# Patient Record
Sex: Male | Born: 1970 | Race: White | Hispanic: No | Marital: Married | Smoking: Never smoker
Health system: Southern US, Community
[De-identification: ages and names within clinical notes are randomized; demographics above are authoritative.]

## PROBLEM LIST (undated history)

## (undated) HISTORY — PX: WRIST SURGERY: SHX841

---

## 2012-02-03 ENCOUNTER — Encounter (HOSPITAL_BASED_OUTPATIENT_CLINIC_OR_DEPARTMENT_OTHER): Payer: Self-pay | Admitting: *Deleted

## 2012-02-03 ENCOUNTER — Emergency Department (HOSPITAL_BASED_OUTPATIENT_CLINIC_OR_DEPARTMENT_OTHER)
Admission: EM | Admit: 2012-02-03 | Discharge: 2012-02-03 | Disposition: A | Discharge status: Home / Self Care | Attending: Emergency Medicine | Admitting: Emergency Medicine

## 2012-02-03 ENCOUNTER — Emergency Department (HOSPITAL_BASED_OUTPATIENT_CLINIC_OR_DEPARTMENT_OTHER)

## 2012-02-03 DIAGNOSIS — Y9289 Other specified places as the place of occurrence of the external cause: Secondary | ICD-10-CM | POA: Insufficient documentation

## 2012-02-03 DIAGNOSIS — S9030XA Contusion of unspecified foot, initial encounter: Secondary | ICD-10-CM

## 2012-02-03 DIAGNOSIS — Y9389 Activity, other specified: Secondary | ICD-10-CM | POA: Insufficient documentation

## 2012-02-03 DIAGNOSIS — IMO0002 Reserved for concepts with insufficient information to code with codable children: Secondary | ICD-10-CM | POA: Insufficient documentation

## 2012-02-03 MED ORDER — IBUPROFEN 400 MG PO TABS
400.0000 mg | ORAL_TABLET | Freq: Once | ORAL | Status: AC
  Administered 2012-02-03: 400 mg via ORAL

## 2012-02-03 MED ORDER — IBUPROFEN 400 MG PO TABS
ORAL_TABLET | ORAL | Status: AC
  Filled 2012-02-03: qty 1

## 2012-02-03 NOTE — ED Provider Notes (Signed)
History    This chart was scribed for Hurman Horn, MD, MD by Smitty Pluck, ED Scribe. The patient was seen in room [facility] and the patient's care was started at 10:49PM.   CSN: [id number]  Arrival date & time 02/03/12  2116      Chief Complaint  Patient presents with  . Foot Injury    (Consider location/radiation/quality/duration/timing/severity/associated sxs/prior treatment) The history is provided by the patient. No language interpreter was used.   Shawn Simpson is a 41 y.o. male who presents to the Emergency Department complaining of constant, mild left foot pain due to hitting his foot with equipment at work today. Pt denies numbness in left foot and any other pain. Bleeding is controlled. Pt reports that he is able to ambulate without difficulty.   History reviewed. No pertinent past medical history.  History reviewed. No pertinent past surgical history.  History reviewed. No pertinent family history.  History  Substance Use Topics  . Smoking status: Never Smoker   . Smokeless tobacco: Not on file  . Alcohol Use: No      Review of Systems 10 Systems reviewed and all are negative for acute change except as noted in the HPI.   Allergies  Sulfa antibiotics  Home Medications  No current outpatient prescriptions on file.  BP 108/72  Pulse 78  Temp 97.9 F (36.6 C) (Oral)  Resp 78  Ht 5\' 11"  (1.803 m)  Wt 200 lb (90.719 kg)  BMI 27.89 kg/m2  SpO2 100%  Physical Exam  Nursing note and vitals reviewed. Constitutional:       Awake, alert, nontoxic appearance.  HENT:  Head: Atraumatic.  Eyes: Right eye exhibits no discharge. Left eye exhibits no discharge.  Neck: Neck supple.  Pulmonary/Chest: Effort normal. He exhibits no tenderness.  Abdominal: Soft. There is no tenderness. There is no rebound.  Musculoskeletal: He exhibits no tenderness.       Left foot DP pulse intact CR less than 2 seconds in all toes of left foot Nl light touch in left foot Non  tender left ankle, left calcaneous and left mid foot  Diffuse tenderness over left forefoot and left toes  Small subungual hematoma under distal tip over left get toenail   Neurological:       Mental status and motor strength appears baseline for patient and situation.  Skin: No rash noted.  Psychiatric: He has a normal mood and affect.    ED Course  Procedures (including critical care time) DIAGNOSTIC STUDIES: Oxygen Saturation is 100% on room air, normal by my interpretation.    COORDINATION OF CARE: 10:52 PM Patient / Family / Caregiver understand and agree with initial ED impression and plan with expectations set for ED visit.    Labs Reviewed - No data to display Dg Foot Complete Left  02/03/2012  *RADIOLOGY REPORT*  Clinical Data: Foot injury, swelling at toes, numbness, nail bed injury great toe  LEFT FOOT - COMPLETE 3+ VIEW  Comparison: None  Findings: Bone mineralization normal. Joint spaces preserved. No fracture, dislocation, or bone destruction.  IMPRESSION: No acute osseous abnormalities.   Original Report Authenticated By: Ulyses Southward, M.D.      1. Foot contusion       MDM  Patient / Family / Caregiver informed of clinical course, understand medical decision-making process, and agree with plan.  Pt stable in ED with no significant deterioration in condition.  I doubt any other EMC precluding discharge at this time including, but not  necessarily limited to the following:displaced Fx.      I personally performed the services described in this documentation, which was scribed in my presence. The recorded information has been reviewed and is accurate.    Hurman Horn, MD 02/04/12 1323

## 2012-02-03 NOTE — ED Notes (Signed)
Pt states he ran over his left foot with a piece of equipment while at work earlier this p.m. Bleeding around nail and bruising also noted. Does not need UDS.

## 2012-08-26 ENCOUNTER — Emergency Department (HOSPITAL_BASED_OUTPATIENT_CLINIC_OR_DEPARTMENT_OTHER): Payer: Self-pay

## 2012-08-26 ENCOUNTER — Encounter (HOSPITAL_BASED_OUTPATIENT_CLINIC_OR_DEPARTMENT_OTHER): Payer: Self-pay | Admitting: *Deleted

## 2012-08-26 ENCOUNTER — Emergency Department (HOSPITAL_BASED_OUTPATIENT_CLINIC_OR_DEPARTMENT_OTHER)
Admission: EM | Admit: 2012-08-26 | Discharge: 2012-08-26 | Disposition: A | Discharge status: Home / Self Care | Payer: Self-pay | Attending: Emergency Medicine | Admitting: Emergency Medicine

## 2012-08-26 DIAGNOSIS — W19XXXA Unspecified fall, initial encounter: Secondary | ICD-10-CM | POA: Insufficient documentation

## 2012-08-26 DIAGNOSIS — M795 Residual foreign body in soft tissue: Secondary | ICD-10-CM | POA: Insufficient documentation

## 2012-08-26 DIAGNOSIS — Y9389 Activity, other specified: Secondary | ICD-10-CM | POA: Insufficient documentation

## 2012-08-26 DIAGNOSIS — Y9289 Other specified places as the place of occurrence of the external cause: Secondary | ICD-10-CM | POA: Insufficient documentation

## 2012-08-26 DIAGNOSIS — W28XXXA Contact with powered lawn mower, initial encounter: Secondary | ICD-10-CM | POA: Insufficient documentation

## 2012-08-26 DIAGNOSIS — S81009A Unspecified open wound, unspecified knee, initial encounter: Secondary | ICD-10-CM | POA: Insufficient documentation

## 2012-08-26 DIAGNOSIS — S91009A Unspecified open wound, unspecified ankle, initial encounter: Secondary | ICD-10-CM | POA: Insufficient documentation

## 2012-08-26 MED ORDER — TRAMADOL HCL 50 MG PO TABS: 50.0000 mg | ORAL_TABLET | Freq: Four times a day (QID) | ORAL | Status: DC | PRN

## 2012-08-26 NOTE — ED Notes (Signed)
Right lower leg injury 4 days ago. He never had the laceration sutured. Now he has redness, bruising and pain at the site. He thinks he has a foreign body inside his skin.

## 2012-08-26 NOTE — ED Provider Notes (Signed)
Medical screening examination/treatment/procedure(s) were performed by non-physician practitioner and as supervising physician I was immediately available for consultation/collaboration.   Carleene Cooper III, MD 08/26/12 [phone]

## 2012-08-26 NOTE — ED Provider Notes (Signed)
History    CSN: [id number] Arrival date & time 08/26/12  1338  First MD Initiated Contact with Patient 08/26/12 1413     Chief Complaint  Patient presents with  . Puncture Wound   (Consider location/radiation/quality/duration/timing/severity/associated sxs/prior Treatment) HPI  Patient is a 42 year old male presented to the emergency department for right lower leg injury that occurred 4 days ago while he was mowing his lawn. Patient states he obtained a small laceration to his right calf but did not have the injury evaluation until now. Patient states he went for the morning and felt like something is inside. He also noted some dark bloody drainage from laceration site during his run that has since stopped. Patient states he has no pain and has not noticed any surrounding erythema. Denies fevers or chills, numbness or tingling in extremity.   History reviewed. No pertinent past medical history. Past Surgical History  Procedure Laterality Date  . Wrist surgery     No family history on file. History  Substance Use Topics  . Smoking status: Never Smoker   . Smokeless tobacco: Not on file  . Alcohol Use: No    Review of Systems  Constitutional: Negative for fever and chills.  Musculoskeletal: Negative for gait problem.  Skin: Positive for wound.    Allergies  Sulfa antibiotics  Home Medications   Current Outpatient Rx  Name  Route  Sig  Dispense  Refill  . traMADol (ULTRAM) 50 MG tablet   Oral   Take 1 tablet (50 mg total) by mouth every 6 (six) hours as needed for pain.   10 tablet   0    BP 130/77  Pulse 94  Temp(Src) 98.6 F (37 C) (Oral)  Resp 18  Ht 6' (1.829 m)  Wt 225 lb (102.059 kg)  BMI 30.51 kg/m2  SpO2 100% Physical Exam  Constitutional: He is oriented to person, place, and time. He appears well-developed and well-nourished. No distress.  HENT:  Head: Normocephalic and atraumatic.  Eyes: Conjunctivae are normal.  Neck: Neck supple.   Cardiovascular: Intact distal pulses.   Musculoskeletal: He exhibits no edema and no tenderness.  Neurological: He is alert and oriented to person, place, and time. He has normal strength. No sensory deficit.  Skin: Skin is warm and dry. Laceration noted. He is not diaphoretic.  2 cm laceration on right calf partially healed.  FB palpated on right calf. No surrounding erythema. No drainage from wound.   Psychiatric: He has a normal mood and affect.    ED Course  FOREIGN BODY REMOVAL Date/Time: 08/26/2012 4:05 PM Performed by: Jeannetta Ellis Authorized by: Jeannetta Ellis Consent: Verbal consent obtained. Consent given by: patient Patient understanding: patient states understanding of the procedure being performed Patient identity confirmed: verbally with patient Time out: Immediately prior to procedure a "time out" was called to verify the correct patient, procedure, equipment, support staff and site/side marked as required. Body area: skin General location: lower extremity Location details: right lower leg Anesthesia: local infiltration Local anesthetic: lidocaine 2% without epinephrine Anesthetic total: 10 ml Patient sedated: no Patient restrained: no Patient cooperative: yes Localization method: serial x-rays Removal mechanism: scalpel and hemostat Dressing: dressing applied Tendon involvement: none Depth: subcutaneous Complexity: simple 2 objects recovered. Objects recovered: metal tab and wire Post-procedure assessment: foreign body removed Patient tolerance: Patient tolerated the procedure well with no immediate complications. Comments: Serial x-rays confirm FB removal.    (including critical care time)  LACERATION REPAIR Performed by: Silvestre Moment,  Dailah Opperman L Authorized by: Jeannetta Ellis Consent: Verbal consent obtained. Risks and benefits: risks, benefits and alternatives were discussed Consent given by: patient Patient identity  confirmed: provided demographic data Prepped and Draped in normal sterile fashion Wound explored  Laceration Location: right calf  Laceration Length: 2 cm  No Foreign Bodies seen or palpated  Anesthesia: local infiltration  Local anesthetic: lidocaine 2% w/o epinephrine  Anesthetic total: 4 ml  Irrigation method: syringe Amount of cleaning: standard  Skin closure: staples  Number of sutures: 3  Technique: staples  Patient tolerance: Patient tolerated the procedure well with no immediate complications.   Labs Reviewed - No data to display Dg Knee 1-2 Views Right  08/26/2012   *RADIOLOGY REPORT*  Clinical Data: Foreign body removal  RIGHT KNEE - 1-2 VIEW  Comparison: Earlier today  Findings: There is been interval removal of the radio-opaque soft tissue foreign body from the posterior medial calf. The osseous structures are intact.  There is mild degenerative change involving the knee joint.  IMPRESSION:  1.  Interval removal of foreign body.   Original Report Authenticated By: Signa Kell, M.D.   Dg Tibia/fibula Right  08/26/2012   *RADIOLOGY REPORT*  Clinical Data: puncture wound due to trauma object thrown out of lawnmower.  RIGHT TIBIA AND FIBULA - 2 VIEW  Comparison: None.  Findings: Two-view study shows a radiopaque foreign body within the soft tissues of the posteromedial calf.  No underlying bony abnormality.  IMPRESSION: Radiopaque soft tissue foreign body in the posteromedial calf.   Original Report Authenticated By: Kennith Center, M.D.   1. Foreign body (FB) in soft tissue     MDM  Tdap UTD. FB noted on X-ray. Successfully removed with superficial opening made with scalpel. Two metal pieces successfully removed. Patient tolerated procedure well. On serial x-ray no FB visualized. Site closed with staples w/o complication. Pt has no co morbidities to effect normal wound healing. Discussed staple home care w pt and answered questions. Pt to f-u for wound check and suture  removal in 10 days. Pt is hemodynamically stable w no complaints prior to dc.    Jeannetta Ellis, PA-C 08/26/12 1646

## 2014-01-07 IMAGING — CR DG TIBIA/FIBULA 2V*R*
4 series · 4 of 4 positions shown · non-contrast
Comparison: None.

CLINICAL DATA: puncture wound due to trauma object thrown out of
lawnmower.

RIGHT TIBIA AND FIBULA - 2 VIEW

[t tib/fib ap right (1 of 2)]
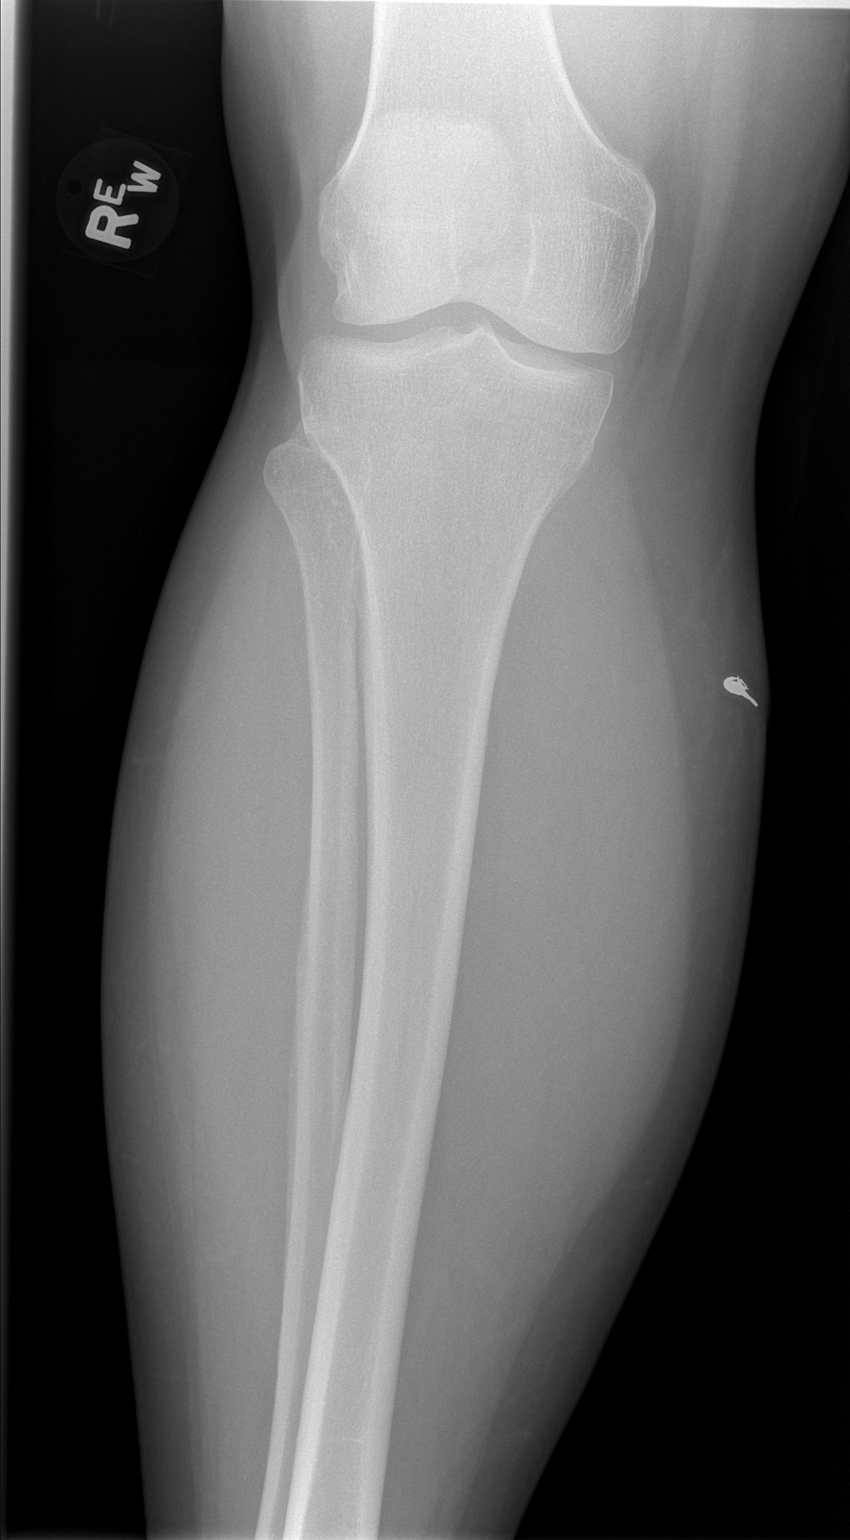

[t tib/fib ap right (2 of 2)]
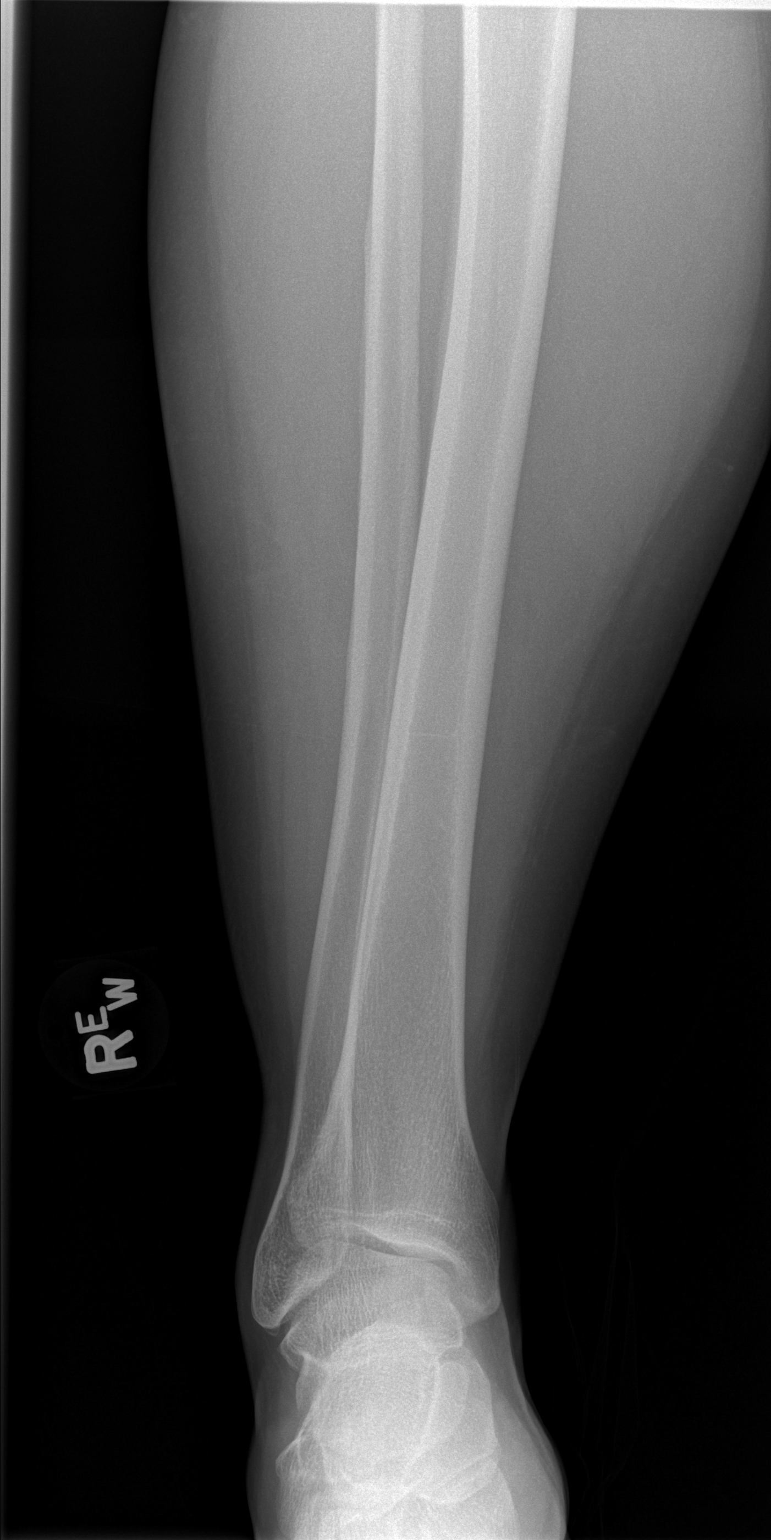

[t tib/fib lat right (1 of 2)]
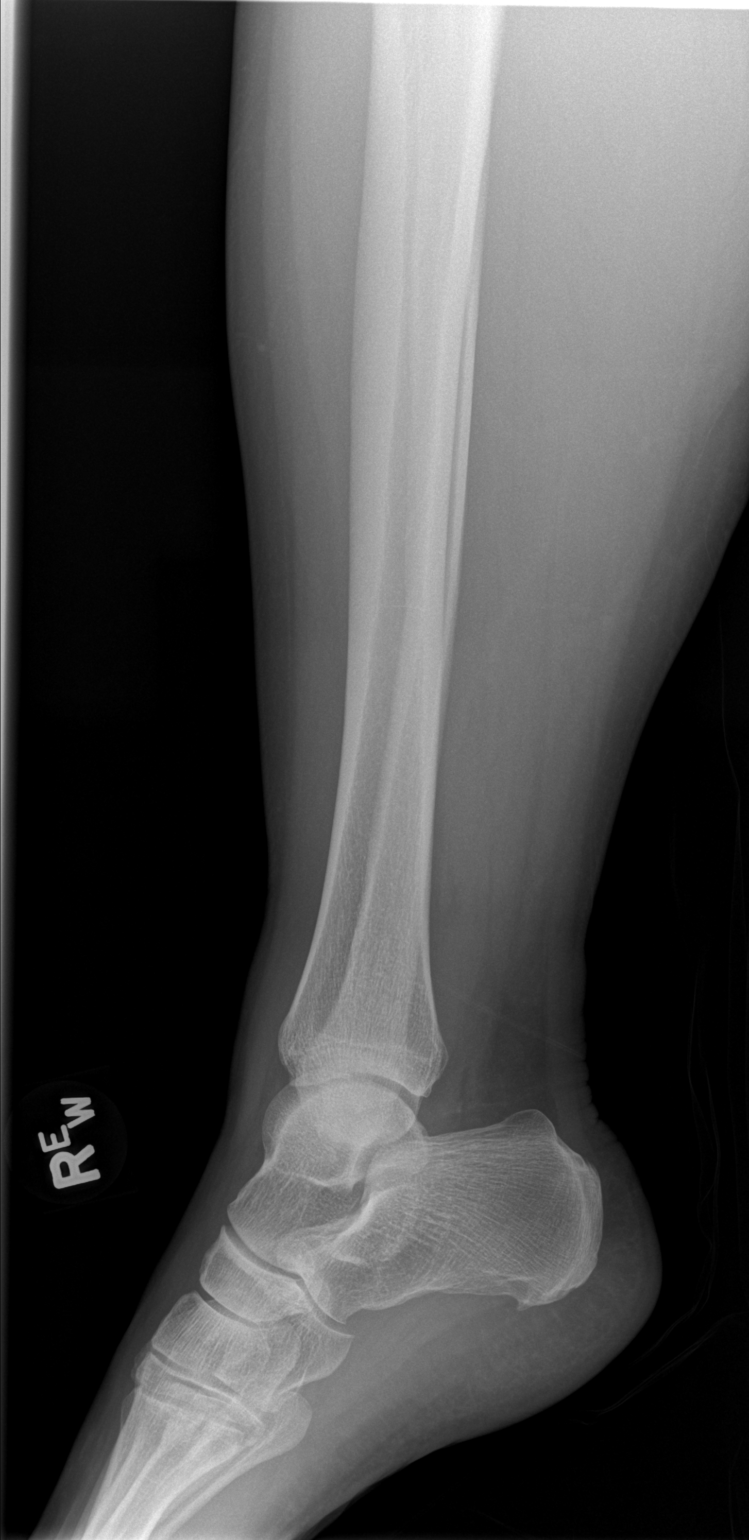

[t tib/fib lat right (2 of 2)]
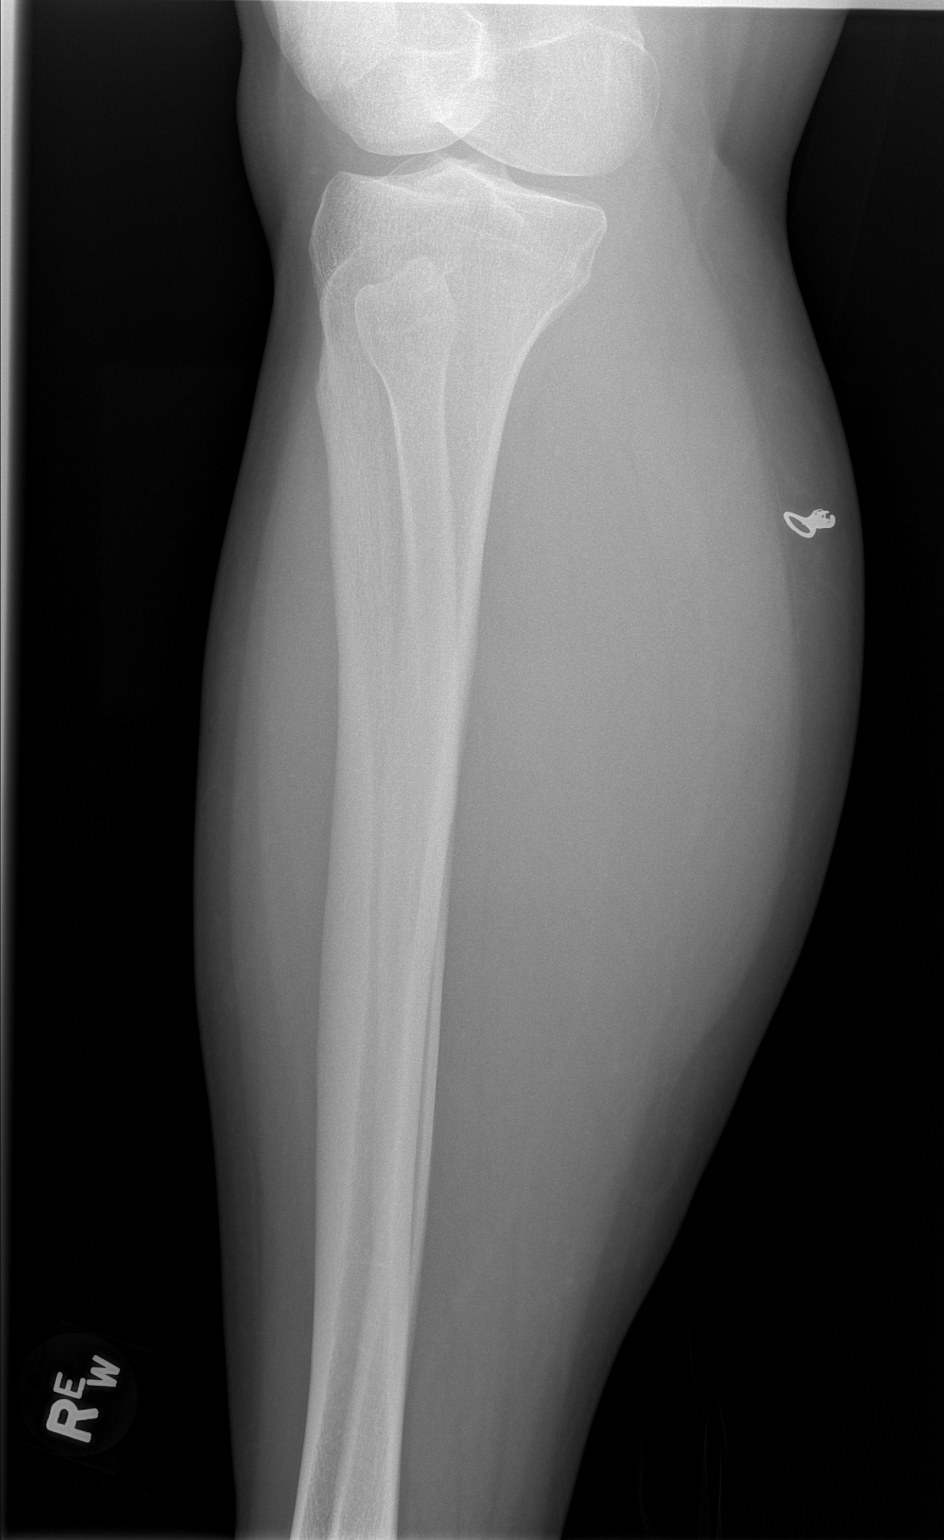

[4 of 4 positions shown; findings below may reference images not displayed]

FINDINGS: Two-view study shows a radiopaque foreign body within the
soft tissues of the posteromedial calf.  No underlying bony
abnormality.
IMPRESSION: Radiopaque soft tissue foreign body in the posteromedial calf.

## 2014-03-30 DIAGNOSIS — E785 Hyperlipidemia, unspecified: Secondary | ICD-10-CM | POA: Insufficient documentation

## 2015-01-07 ENCOUNTER — Telehealth: Payer: Self-pay | Admitting: *Deleted

## 2015-01-07 ENCOUNTER — Encounter: Payer: Self-pay | Admitting: Sports Medicine

## 2015-01-07 ENCOUNTER — Ambulatory Visit (INDEPENDENT_AMBULATORY_CARE_PROVIDER_SITE_OTHER): Payer: Federal, State, Local not specified - PPO

## 2015-01-07 ENCOUNTER — Ambulatory Visit (INDEPENDENT_AMBULATORY_CARE_PROVIDER_SITE_OTHER): Payer: Federal, State, Local not specified - PPO | Admitting: Sports Medicine

## 2015-01-07 DIAGNOSIS — M216X2 Other acquired deformities of left foot: Secondary | ICD-10-CM

## 2015-01-07 DIAGNOSIS — M79673 Pain in unspecified foot: Secondary | ICD-10-CM | POA: Diagnosis not present

## 2015-01-07 DIAGNOSIS — M216X1 Other acquired deformities of right foot: Secondary | ICD-10-CM

## 2015-01-07 DIAGNOSIS — M722 Plantar fascial fibromatosis: Secondary | ICD-10-CM | POA: Diagnosis not present

## 2015-01-07 MED ORDER — METHYLPREDNISOLONE 4 MG PO TBPK: ORAL_TABLET | ORAL | Status: DC

## 2015-01-07 NOTE — Patient Instructions (Signed)

## 2015-01-07 NOTE — Progress Notes (Deleted)
   Subjective:    Patient ID: Shawn Simpson, male    DOB: 03/03/1970, 44 y.o.   MRN: [id number]  HPI    Review of Systems  All other systems reviewed and are negative.      Objective:   Physical Exam        Assessment & Plan:

## 2015-01-07 NOTE — Progress Notes (Addendum)
Patient ID: Shawn Simpson, male   DOB: 07/02/1970, 44 y.o.   MRN: [id number] Subjective: Shawn Simpson is a 44 y.o. male patient presents to office with complaint of heel pain on the right> left; was told plantar fasciitis but wants 2nd opinion. Patient admits to post static dyskinesia for > 9 months on right and a few weeks on left. Worse in the morning 7/10. 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 Patient has treated this problem with stretching, icing, change of shoes, inserts, 3 steroid injections with little to no improvement. States it feels best after stretching and ice. Patient denies any other pedal complaints. Denies any lower extremity, knee, hip, back problems.   Review of Systems  All other systems reviewed and are negative.  There are no active problems to display for this patient.  Current Outpatient Prescriptions on File Prior to Visit  Medication Sig Dispense Refill  . traMADol (ULTRAM) 50 MG tablet Take 1 tablet (50 mg total) by mouth every 6 (six) hours as needed for pain. 10 tablet 0   No current facility-administered medications on file prior to visit.   Allergies  Allergen Reactions  . Sulfa Antibiotics Hives     Objective: Physical Exam General: The patient is alert and oriented x3 in no acute distress.  Dermatology: Skin is warm, dry and supple bilateral lower extremities. Nails 1-10 are normal. There is no erythema, edema, no eccymosis, no open lesions present. Integument is otherwise unremarkable.  Vascular: Dorsalis Pedis pulse and Posterior Tibial pulse are 2/4 bilateral. Capillary fill time is immediate to all digits.  Neurological: Grossly intact to light touch with an achilles reflex of +2/5 and a  negative Tinel's sign bilateral.  Musculoskeletal: Tenderness to palpation at the medial calcaneal tubercale and through the insertion of the plantar fascia on the right>left foot. No pain with compression of calcaneus bilateral. No  pain with tuning fork to calcaneus bilateral. No pain with calf compression bilateral. There is decreased Ankle joint range of motion bilateral, right>left. All other joints range of motion within normal limits bilateral. Strength 5/5 in all groups bilateral.   Gait: Unassisted, Mildly Antalgic avoid weight on right>left heel  Xray, Right & Left foot: 3 Views Normal osseous mineralization. Joint spaces preserved. No fracture/dislocation/boney destruction. Calcaneal spur present with mild thickening of plantar fascia R>L. No other soft tissue abnormalities or radiopaque foreign bodies.   Assessment and Plan: Problem List Items Addressed This Visit    None    Visit Diagnoses    Foot pain, unspecified laterality    -  Primary    Relevant Medications    methylPREDNISolone (MEDROL DOSEPAK) 4 MG TBPK tablet    Other Relevant Orders    DG Foot Complete Left    DG Foot Complete Right    Plantar fasciitis of right foot        Chronic >9 months    Relevant Medications    methylPREDNISolone (MEDROL DOSEPAK) 4 MG TBPK tablet    Plantar fasciitis, left        Relevant Medications    methylPREDNISolone (MEDROL DOSEPAK) 4 MG TBPK tablet    Acquired equinus deformity of both feet        R>L      -Complete examination performed. Discussed with patient in detail the condition of plantar fasciitis, how this occurs and general treatment options. Explained both conservative and surgical treatments.  -Patient has completed a series of 3 injections with the last injection a  few weeks ago; no injection administered at this visit -Rx Medrol dose pack with instructions on use. No anti-inflammatories patient reports that he stopped taking mobic because of cardiovascular concern and elevation of cholesterol.  -Recommended good supportive shoes and advised use of OTC insert; superfeet of which he owns. Explained to patient that if these orthoses work well, we will continue with these. If these do not improve his  condition and  pain, we will consider custom molded orthoses in the future. - Explained in detail the use of the fascial brace/ night splint which was dispensed at today's visit. -Explained and dispensed to patient daily stretching exercises. -Recommend patient to ice affected area 1-2x daily. -Will order MRI for right foot to evaluate plantar fascia; office to contact patient to arrange; advised patient to consider EPAT/EPR; will have a follow up discussion about this pending results of the MRI.  -Patient to return to office in 4 weeks for follow up/discus MRI results or sooner if problems or questions arise.  [name], DPM

## 2015-01-07 NOTE — Telephone Encounter (Addendum)
-----   Message from Royitorya Stover, [place] sent at 01/07/2015 10:59 AM EST ----- Regarding: Schedule for MRI Hi Noam Franzen Can you order and contact patient for MRI, right foot without contrast. Diagnosis chronic >9 mo plantar fasciitis not improved with previous conservative treatments.  Thank you Dr Marylene LandStover [facility] and informed pt of orders and appt phone number.  Prior Authorization started for MRI right foot 73718, dx M72.2, Lars PinksJerry - [employer] Specialty S[employer] state [employer] Employees do not require prior authorization through [employer], to contact Customer Svc to confirm.  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  ORD[facility]ED TO [facility].

## 2015-02-04 ENCOUNTER — Ambulatory Visit (INDEPENDENT_AMBULATORY_CARE_PROVIDER_SITE_OTHER): Payer: Federal, State, Local not specified - PPO | Admitting: Sports Medicine

## 2015-02-04 ENCOUNTER — Encounter: Payer: Self-pay | Admitting: Sports Medicine

## 2015-02-04 DIAGNOSIS — M722 Plantar fascial fibromatosis: Secondary | ICD-10-CM | POA: Diagnosis not present

## 2015-02-04 DIAGNOSIS — M216X1 Other acquired deformities of right foot: Secondary | ICD-10-CM | POA: Diagnosis not present

## 2015-02-04 DIAGNOSIS — M79673 Pain in unspecified foot: Secondary | ICD-10-CM | POA: Diagnosis not present

## 2015-02-04 DIAGNOSIS — M216X2 Other acquired deformities of left foot: Secondary | ICD-10-CM

## 2015-02-04 MED ORDER — IBUPROFEN 600 MG PO TABS: 600.0000 mg | ORAL_TABLET | Freq: Three times a day (TID) | ORAL | Status: DC | PRN

## 2015-02-04 NOTE — Progress Notes (Signed)
Patient ID: Shawn Simpson, male   DOB: 11/12/1970, 44 y.o.   MRN: [id number]  Subjective: Shawn Simpson is a 44 y.o. male patient returns to office with complaint of heel pain on the right> left; Review of MRI results on right. Patient admits to post static dyskinesia for > 10 months on right and a few weeks to 1-2 months now on left. Patient reports that both heels are feeling a little better especially since he is off of work right now using his extra vacation time. Patient tolerated dose pack with no problems. Patient denies any other pedal complaints.    There are no active problems to display for this patient.  Current Outpatient Prescriptions on File Prior to Visit  Medication Sig Dispense Refill  . atorvastatin (LIPITOR) 10 MG tablet Take 10 mg by mouth.    . Cholecalciferol (VITAMIN D) 2000 UNITS tablet Take 4,000 Units by mouth.    . lovastatin (MEVACOR) 10 MG tablet Take 10 mg by mouth daily.  0  . meloxicam (MOBIC) 15 MG tablet Take 15 mg by mouth daily.  2  . methylPREDNISolone (MEDROL DOSEPAK) 4 MG TBPK tablet Take as directed 21 tablet 0  . traMADol (ULTRAM) 50 MG tablet Take 1 tablet (50 mg total) by mouth every 6 (six) hours as needed for pain. 10 tablet 0   No current facility-administered medications on file prior to visit.   Allergies  Allergen Reactions  . Sulfa Antibiotics Hives     Objective: Physical Exam General: The patient is alert and oriented x3 in no acute distress.  Dermatology: Skin is warm, dry and supple bilateral lower extremities. Nails 1-10 are normal. There is no erythema, edema, no eccymosis, no open lesions present. Integument is otherwise unremarkable.  Vascular: Dorsalis Pedis pulse and Posterior Tibial pulse are 2/4 bilateral. Capillary fill time is immediate to all digits.  Neurological: Grossly intact to light touch with an achilles reflex of +2/5 and a  negative Tinel's sign bilateral.  Musculoskeletal: There is Tenderness to palpation  at the medial calcaneal tubercale and through the insertion of the plantar fascia on the right>left foot. No pain with compression of calcaneus bilateral. No pain with tuning fork to calcaneus bilateral. No pain with calf compression bilateral. There is decreased Ankle joint range of motion bilateral, right>left. All other joints range of motion within normal limits bilateral. Strength 5/5 in all groups bilateral.   MRI R foot 01-09-15:  Impression: 1. Plantar fasciitis 2. Mild posterior tibial tendonitis  Assessment and Plan: Problem List Items Addressed This Visit    None    Visit Diagnoses    Plantar fasciitis of right foot    -  Primary    Plantar fasciitis, left        Foot pain, unspecified laterality        Relevant Medications    ibuprofen (ADVIL,MOTRIN) 600 MG tablet    Acquired equinus deformity of both feet          -Complete examination performed. Discussed with patient in detail the condition of plantar fasciitis, how this occurs and general treatment options. Explained both conservative and surgical treatments.  -MRI results reviewed and discussed  -Patient declines EPF at this time. Provided patient with information on shockwave to consider in the meantime.  -Will reconsider a new series of steroid injections in continues to have pain with palpation -Rx Motrin 600mg  tid prn pain; advised patient on adverse reactions. Patient does not want to try Mobic because of concern  for cardiovascular risks. -Recommended to cont with good supportive shoes; patient wants to try Birkenstocks and advised use of OTC insert; superfeet of which he owns. Explained to patient that if these orthoses work well, we will continue with these. If these do not improve his condition and  pain, we will consider custom molded orthoses in the future. -Cont with fascial braces and night splint daily. -Cont daily stretching exercises. -Recommend patient to cont to ice affected area 1-2x daily. -Rx PT at  [facility] with modalities.  -Work note given to patient. -Patient to return to office in 3 weeks for follow up or sooner if problems or questions arise. If no improvement with PT will consider EPAT.   [name], DPM

## 2015-02-08 ENCOUNTER — Encounter: Payer: Self-pay | Admitting: Sports Medicine

## 2015-02-25 ENCOUNTER — Encounter: Payer: Self-pay | Admitting: *Deleted

## 2015-02-25 ENCOUNTER — Ambulatory Visit (INDEPENDENT_AMBULATORY_CARE_PROVIDER_SITE_OTHER): Payer: Federal, State, Local not specified - PPO | Admitting: Sports Medicine

## 2015-02-25 ENCOUNTER — Encounter: Payer: Self-pay | Admitting: Sports Medicine

## 2015-02-25 DIAGNOSIS — M79673 Pain in unspecified foot: Secondary | ICD-10-CM

## 2015-02-25 DIAGNOSIS — M779 Enthesopathy, unspecified: Secondary | ICD-10-CM | POA: Diagnosis not present

## 2015-02-25 DIAGNOSIS — M722 Plantar fascial fibromatosis: Secondary | ICD-10-CM | POA: Diagnosis not present

## 2015-02-25 DIAGNOSIS — M216X2 Other acquired deformities of left foot: Secondary | ICD-10-CM

## 2015-02-25 DIAGNOSIS — M216X1 Other acquired deformities of right foot: Secondary | ICD-10-CM

## 2015-02-25 NOTE — Progress Notes (Signed)
Patient ID: Shawn Simpson, male   DOB: 09/21/1970, 45 y.o.   MRN: [id number]  Subjective: Shawn LincolnJimmy Simpson is a 45 y.o. male patient returns to office for follow up evaluation of bilateral heel pain. Patient is currently in physical therapy and has reported that therapy has worked wonders with the dry needling approach, especially on the right has significantly improved his pain and symptoms. States that he no longer has pain on the left however start walking this past week and now has a small amount of pain on the right heel; thinks he may been aggravated by his shoe choice.  Patient is currently taking time off work which has significantly helped with his improvement. Patient denies any other pedal complaints.    There are no active problems to display for this patient.  Current Outpatient Prescriptions on File Prior to Visit  Medication Sig Dispense Refill  . ibuprofen (ADVIL,MOTRIN) 600 MG tablet Take 1 tablet (600 mg total) by mouth every 8 (eight) hours as needed. 30 tablet 0  . lovastatin (MEVACOR) 10 MG tablet Take 10 mg by mouth daily.  0   No current facility-administered medications on file prior to visit.   Allergies  Allergen Reactions  . Sulfa Antibiotics Hives     Objective: Physical Exam General: The patient is alert and oriented x3 in no acute distress.  Dermatology: Skin is warm, dry and supple bilateral lower extremities. Nails 1-10 are normal. There is no erythema, edema, no eccymosis, no open lesions present. Integument is otherwise unremarkable.  Vascular: Dorsalis Pedis pulse and Posterior Tibial pulse are 2/4 bilateral. Capillary fill time is immediate to all digits.  Neurological: Grossly intact to light touch with an achilles reflex of +2/5 and a  negative Tinel's sign bilateral.  Musculoskeletal: There is Mild Tenderness to palpation at the medial calcaneal tubercale and through the insertion of the plantar fascia on the right and very minimal at posterior  tibial tendon distal to be medial malleolus.  No pain on palpation to left on today's exam.  No pain with compression of calcaneus bilateral. No pain with tuning fork to calcaneus bilateral. No pain with calf compression bilateral. There is decreased Ankle joint range of motion bilateral, right>left. All other joints range of motion within normal limits bilateral. Strength 5/5 in all groups bilateral.    Assessment and Plan: Problem List Items Addressed This Visit    None    Visit Diagnoses    Plantar fasciitis of right foot    -  Primary    Plantar fasciitis, left        Foot pain, unspecified laterality        Acquired equinus deformity of both feet        Tendonitis          -Complete examination performed. Discussed with patient in detail the condition of plantar fasciitis, how this occurs and general treatment options. Explained both conservative and surgical treatments.  - Prescribed topical compound cream to use daily - continue with physical therapy - continue with Motrin as needed -Recommended to cont with good supportive shoes;  Specific for activity. Advised patient to use walking or running shoes for that particular activity and dress shoes for casual dress do not interchange the styles. -Cont with fascial braces and night splint daily. -Cont daily stretching exercises. -Recommend patient to cont to ice affected area 1-2x daily. - continue with limitation/ refrain from work for the next 2 weeks will determine work status at next encounter -Patient  to return to office in 2 weeks for follow up or sooner if problems or questions arise.   [name], DPM

## 2015-03-11 ENCOUNTER — Ambulatory Visit (INDEPENDENT_AMBULATORY_CARE_PROVIDER_SITE_OTHER): Payer: Federal, State, Local not specified - PPO | Admitting: Sports Medicine

## 2015-03-11 ENCOUNTER — Encounter: Payer: Self-pay | Admitting: Sports Medicine

## 2015-03-11 DIAGNOSIS — M79673 Pain in unspecified foot: Secondary | ICD-10-CM

## 2015-03-11 DIAGNOSIS — M216X1 Other acquired deformities of right foot: Secondary | ICD-10-CM

## 2015-03-11 DIAGNOSIS — M216X2 Other acquired deformities of left foot: Secondary | ICD-10-CM | POA: Diagnosis not present

## 2015-03-11 DIAGNOSIS — M722 Plantar fascial fibromatosis: Secondary | ICD-10-CM | POA: Diagnosis not present

## 2015-03-11 MED ORDER — IBUPROFEN 600 MG PO TABS: 600.0000 mg | ORAL_TABLET | Freq: Three times a day (TID) | ORAL | Status: DC | PRN

## 2015-03-11 NOTE — Progress Notes (Signed)
Patient ID: Shawn Simpson, male   DOB: [birth year], 45 y.o.   MRN: [id number]  Subjective: Shawn Simpson is a 44 y.o. male patient returns to office for follow up evaluation of bilateral heel pain. Patient is currently in physical therapy and has reported that therapy has helped; he feel better each week. Reports that he has a little 3/10 occasional pain with direct pressure to right heel otherwise no other symptoms.  Patient is currently taking time off work which has significantly helped with his improvement. Patient denies any other pedal complaints.    There are no active problems to display for this patient.  Current Outpatient Prescriptions on File Prior to Visit  Medication Sig Dispense Refill  . lovastatin (MEVACOR) 10 MG tablet Take 10 mg by mouth daily.  0   No current facility-administered medications on file prior to visit.   Allergies  Allergen Reactions  . Sulfa Antibiotics Hives     Objective: Physical Exam General: The patient is alert and oriented x3 in no acute distress.  Dermatology: Skin is warm, dry and supple bilateral lower extremities. Nails 1-10 are normal. There is no erythema, edema, no eccymosis, no open lesions present. Integument is otherwise unremarkable.  Vascular: Dorsalis Pedis pulse and Posterior Tibial pulse are 2/4 bilateral. Capillary fill time is immediate to all digits.  Neurological: Grossly intact to light touch with an achilles reflex of +2/5 and a  negative Tinel's sign bilateral.  Musculoskeletal: There is Mild Tenderness to palpation at the medial calcaneal tubercale and through the insertion of the plantar fascia on the right and no pain at posterior tibial tendon.  No pain on palpation to left on today's exam.  No pain with compression of calcaneus bilateral. No pain with tuning fork to calcaneus bilateral. No pain with calf compression bilateral. There is decreased Ankle joint range of motion bilateral, right>left, improving in nature.  All other joints range of motion within normal limits bilateral. Strength 5/5 in all groups bilateral.    Assessment and Plan: Problem List Items Addressed This Visit    None    Visit Diagnoses    Foot pain, unspecified laterality    -  Primary    Relevant Medications    ibuprofen (ADVIL,MOTRIN) 600 MG tablet    Plantar fasciitis of right foot        Plantar fasciitis, left        Acquired equinus deformity of both feet          -Complete examination performed. Discussed with patient in detail the condition of plantar fasciitis, how this occurs and general treatment options. Explained both conservative and surgical treatments.  - Continue topical compound cream to use daily - Continue with physical therapy - Continue with Motrin as needed; refilled ordered to use only if experiencing exacerbation of pain especially now since he will be returning to work -Recommended to cont with good supportive shoes;  Specific for activity. May return to normal activities. -Wean from fascial braces and night splint daily. -Cont daily stretching exercises. -Recommend patient to use heel cup on right. -Recommend patient to cont to ice affected area 1-2x daily. -Patient may return to work with no restrictions; work note given.  -Patient to return to office in 4- 6 weeks for follow up or sooner if problems or questions arise.   [name], DPM

## 2015-04-22 ENCOUNTER — Ambulatory Visit (INDEPENDENT_AMBULATORY_CARE_PROVIDER_SITE_OTHER): Payer: Federal, State, Local not specified - PPO | Admitting: Sports Medicine

## 2015-04-22 ENCOUNTER — Encounter: Payer: Self-pay | Admitting: Sports Medicine

## 2015-04-22 DIAGNOSIS — M216X2 Other acquired deformities of left foot: Secondary | ICD-10-CM

## 2015-04-22 DIAGNOSIS — M722 Plantar fascial fibromatosis: Secondary | ICD-10-CM

## 2015-04-22 DIAGNOSIS — M79673 Pain in unspecified foot: Secondary | ICD-10-CM | POA: Diagnosis not present

## 2015-04-22 DIAGNOSIS — M216X1 Other acquired deformities of right foot: Secondary | ICD-10-CM | POA: Diagnosis not present

## 2015-04-22 NOTE — Progress Notes (Signed)
Patient ID: Shawn Simpson, male   DOB: 1970/03/16, 45 y.o.   MRN: [id number]  Subjective: Shawn Simpson is a 45 y.o. male patient returns to office for follow up evaluation of bilateral heel pain. Patient states that he is doing better; only hurts now after 4 hours of work or standing; desires to start running. Patient denies any other pedal complaints.    There are no active problems to display for this patient.  Current Outpatient Prescriptions on File Prior to Visit  Medication Sig Dispense Refill  . ibuprofen (ADVIL,MOTRIN) 600 MG tablet Take 1 tablet (600 mg total) by mouth every 8 (eight) hours as needed. 30 tablet 0  . lovastatin (MEVACOR) 10 MG tablet Take 10 mg by mouth daily.  0   No current facility-administered medications on file prior to visit.   Allergies  Allergen Reactions  . Sulfa Antibiotics Hives     Objective: Physical Exam General: The patient is alert and oriented x3 in no acute distress.  Dermatology: Skin is warm, dry and supple bilateral lower extremities. Nails 1-10 are normal. There is no erythema, edema, no eccymosis, no open lesions present. Integument is otherwise unremarkable.  Vascular: Dorsalis Pedis pulse and Posterior Tibial pulse are 2/4 bilateral. Capillary fill time is immediate to all digits.  Neurological: Grossly intact to light touch with an achilles reflex of +2/5 and a  negative Tinel's sign bilateral.  Musculoskeletal: There is decreasedTenderness to palpation at the medial calcaneal tubercale and through the insertion of the plantar fascia on the right.  No pain on palpation to left on today's exam.  No pain with compression of calcaneus bilateral. No pain with tuning fork to calcaneus bilateral. No pain with calf compression bilateral. There is decreased Ankle joint range of motion bilateral, right>left, improving in nature. All other joints range of motion within normal limits bilateral. Strength 5/5 in all groups bilateral.     Assessment and Plan: Problem List Items Addressed This Visit    None    Visit Diagnoses    Foot pain, unspecified laterality    -  Primary    Plantar fasciitis of right foot        improving    Plantar fasciitis, left        improving    Acquired equinus deformity of both feet          -Complete examination performed. Discussed with patient in detail the condition of plantar fasciitis, how this occurs and general treatment options. Explained both conservative and surgical treatments.  - Continue topical compound cream to use daily - Continue with physical therapy until complete - Continue with Motrin as needed -Recommended to cont with inserts and good supportive shoes;  Specific for activity. May return to normal activities and running with slow increases in mileage -Use night splint as needed -Cont daily stretching exercises. -Recommend patient to cont to ice affected area 1-2x daily especially at work during lunch break. -Patient to return to office as needed for follow up or sooner if problems or questions arise.   [name], DPM

## 2015-09-22 ENCOUNTER — Encounter: Payer: Self-pay | Admitting: Sports Medicine

## 2015-09-22 ENCOUNTER — Ambulatory Visit (INDEPENDENT_AMBULATORY_CARE_PROVIDER_SITE_OTHER): Payer: Federal, State, Local not specified - PPO | Admitting: Sports Medicine

## 2015-09-22 DIAGNOSIS — M216X1 Other acquired deformities of right foot: Secondary | ICD-10-CM | POA: Diagnosis not present

## 2015-09-22 DIAGNOSIS — M779 Enthesopathy, unspecified: Secondary | ICD-10-CM

## 2015-09-22 DIAGNOSIS — M79673 Pain in unspecified foot: Secondary | ICD-10-CM | POA: Diagnosis not present

## 2015-09-22 DIAGNOSIS — M722 Plantar fascial fibromatosis: Secondary | ICD-10-CM | POA: Diagnosis not present

## 2015-09-22 DIAGNOSIS — M216X2 Other acquired deformities of left foot: Secondary | ICD-10-CM

## 2015-09-22 NOTE — Progress Notes (Signed)
Patient ID: Shawn Simpson, male   DOB: 21-Jul-1970, 45 y.o.   MRN: [id number]  Subjective: Shawn Simpson is a 45 y.o. male patient returns to office for follow up evaluation of bilateral heel pain. Reports that he has no pain on the left. All of his symptoms on the right. States that over the last few months has experienced approximately 2 flareups requiring rest and time off. States that his foot feels better with rest.  States that he can only tolerate about 8 hours of work or standing and has been in bicycle for exercise. States that he has tried several different thousand shoes and has noted that different pain can aggravate his heel causing it to feel uncomfortable. Patient denies any other pedal complaints.    There are no active problems to display for this patient.  Current Outpatient Prescriptions on File Prior to Visit  Medication Sig Dispense Refill  . ibuprofen (ADVIL,MOTRIN) 600 MG tablet Take 1 tablet (600 mg total) by mouth every 8 (eight) hours as needed. 30 tablet 0  . lovastatin (MEVACOR) 10 MG tablet Take 10 mg by mouth daily.  0  . meloxicam (MOBIC) 15 MG tablet      No current facility-administered medications on file prior to visit.    Allergies  Allergen Reactions  . Sulfa Antibiotics Hives     Objective: Physical Exam General: The patient is alert and oriented x3 in no acute distress.  Dermatology: Skin is warm, dry and supple bilateral lower extremities. Nails 1-10 are normal. There is no erythema, edema, no eccymosis, no open lesions present. Integument is otherwise unremarkable.  Vascular: Dorsalis Pedis pulse and Posterior Tibial pulse are 2/4 bilateral. Capillary fill time is immediate to all digits.  Neurological: Grossly intact to light touch with an achilles reflex of +2/5 and a negative Tinel's sign bilateral.  Musculoskeletal: There is Mild to palpation at the medial calcaneal tubercale and through the insertion of the plantar fascia on the right.  No  pain on palpation to left.  No pain with compression of calcaneus bilateral. No pain with tuning fork to calcaneus bilateral. No pain with calf compression bilateral. There is decreased Ankle joint range of motion bilateral, right>left. All other joints range of motion within normal limits bilateral. Strength 5/5 in all groups bilateral.    Assessment and Plan: Problem List Items Addressed This Visit    None    Visit Diagnoses    Plantar fasciitis of right foot    -  Primary   Plantar fasciitis, left       Resolved   Acquired equinus deformity of both feet       Tendonitis       Achilles, right greater than left   Foot pain, unspecified laterality         -Complete examination performed. Discussed with patient in detail the condition of plantar fasciitis with equinus, how this occurs and general treatment options. Explained both conservative and surgical treatments.  - Continue topical compound cream to use daily - Continue with Motrin as needed for now until he had been started on EPAT. Advised patient that during this treatment will not be able to use NSAIDs, ice or any anti-inflammatories. -Recommended to cont with inserts and good supportive shoes;  Specific for activity. May continue with biking and exercise to tolerance. Patient is also considering dietitian consult for weight loss. -Use night splint as needed -Cont daily stretching exercises and home exercises as taught by PT. -Recommend patient to cont  to ice affected area 1-2x daily especially at work during lunch break as needed until EPAT/shockwave treatments has begun. -Patient to be scheduled for shockwave therapy for right plantar fascial residual symptoms. Advised patient that if shockwave fails, may benefit from gastroc release and endoscopic plantar fascial release counseled patient on etiology and significant equinus which is likely strongly contributing to continued symptoms -FMLA  paper work completed for patient with  documentation for future plans to undergo shockwave therapy and possible need for future time off to accommodate for occasional flareups that he may experience, Once to twice a month requiring approximately 1 day of rest. Also requested reduced work schedule of 5 days per week with 8 hour or less work shifts.  -Patient to return to office for EPAT #1 in [place] or sooner if problems or questions arise.   Shawn Simpson, DPM

## 2015-09-23 DIAGNOSIS — M79672 Pain in left foot: Secondary | ICD-10-CM

## 2015-09-28 ENCOUNTER — Ambulatory Visit (INDEPENDENT_AMBULATORY_CARE_PROVIDER_SITE_OTHER): Payer: Federal, State, Local not specified - PPO

## 2015-09-28 DIAGNOSIS — M722 Plantar fascial fibromatosis: Secondary | ICD-10-CM

## 2015-09-28 NOTE — Progress Notes (Signed)
   Subjective:    Patient ID: Shawn Simpson, male    DOB: 06/17/1970, 45 y.o.   MRN: [id number]  HPI Pt presents stating that he has pain in the heel of his right foot, ongoing with intermittent flare ups over past few months   Review of Systems All other symptoms are negative    Objective:   Physical Exam Pain on palpation medial plantar fascial band of heel Rt foot, 8 of 10       Assessment & Plan:  ESWT administered to Rt heel at 17 joules for 3000 pulses, tolerated well, EPAt administered to surrounding areas for 3000 pulses. Advised gainst use of anti-inflammatories and ice. Re-appointed in 1 week for 2nd treatment

## 2015-10-05 ENCOUNTER — Ambulatory Visit (INDEPENDENT_AMBULATORY_CARE_PROVIDER_SITE_OTHER): Payer: Federal, State, Local not specified - PPO

## 2015-10-05 DIAGNOSIS — M722 Plantar fascial fibromatosis: Secondary | ICD-10-CM

## 2015-10-05 NOTE — Progress Notes (Signed)
   Subjective:    Patient ID: Shawn Simpson, male    DOB: 06/07/1970, 45 y.o.   MRN: [id number]  HPI Pt presents stating that he has pain in the heel of his right foot, ongoing with intermittent flare ups over past few months   Review of Systems All other symptoms are negative    Objective:   Physical Exam Pain on palpation medial plantar fascial band of heel Rt foot, 8 of 10       Assessment & Plan:  ESWT administered to Rt heel at 17 joules for 3000 pulses, tolerated well, EPAt administered to surrounding areas for 3000 pulses. Advised gainst use of anti-inflammatories and ice. Re-appointed in 1 week for 3rd treatment

## 2015-10-12 ENCOUNTER — Ambulatory Visit (INDEPENDENT_AMBULATORY_CARE_PROVIDER_SITE_OTHER): Payer: Federal, State, Local not specified - PPO

## 2015-10-12 DIAGNOSIS — M722 Plantar fascial fibromatosis: Secondary | ICD-10-CM

## 2015-10-13 NOTE — Progress Notes (Signed)
   Subjective:    Patient ID: Shawn Simpson, male    DOB: 01/05/1971, 45 y.o.   MRN: [id number]  HPI Pt presents stating that he has pain in the heel of his right foot, ongoing with intermittent flare ups over past few months   Review of Systems All other symptoms are negative    Objective:   Physical Exam Pain on palpation medial plantar fascial band of heel Rt foot, 8 of 10       Assessment & Plan:  ESWT administered to Rt heel at 25 joules for 3000 pulses, tolerated well, EPAt administered to surrounding areas for 3000 pulses. Advised gainst use of anti-inflammatories and ice. Re-appointed in 4 weeks for re-evaluation with Marylene LandStover and possible 4th treatment

## 2015-11-09 ENCOUNTER — Ambulatory Visit (INDEPENDENT_AMBULATORY_CARE_PROVIDER_SITE_OTHER): Payer: Federal, State, Local not specified - PPO

## 2015-11-09 DIAGNOSIS — M722 Plantar fascial fibromatosis: Secondary | ICD-10-CM

## 2015-11-09 NOTE — Progress Notes (Signed)
   Subjective:    Patient ID: Shawn LincolnJimmy Simpson, male    DOB: 05/11/1970, 45 y.o.   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 Advised gainst use of anti-inflammatories and ice. Re-appointed in 6 weeks for re-evaluation with Marylene LandStover.

## 2015-12-21 ENCOUNTER — Ambulatory Visit: Payer: Federal, State, Local not specified - PPO | Admitting: Sports Medicine

## 2016-01-11 ENCOUNTER — Ambulatory Visit: Payer: Federal, State, Local not specified - PPO | Admitting: Sports Medicine

## 2016-02-25 ENCOUNTER — Ambulatory Visit (INDEPENDENT_AMBULATORY_CARE_PROVIDER_SITE_OTHER): Payer: Federal, State, Local not specified - PPO | Admitting: Sports Medicine

## 2016-02-25 ENCOUNTER — Encounter: Payer: Self-pay | Admitting: Sports Medicine

## 2016-02-25 DIAGNOSIS — M722 Plantar fascial fibromatosis: Secondary | ICD-10-CM

## 2016-02-25 DIAGNOSIS — M216X1 Other acquired deformities of right foot: Secondary | ICD-10-CM

## 2016-02-25 DIAGNOSIS — M216X2 Other acquired deformities of left foot: Secondary | ICD-10-CM

## 2016-02-25 MED ORDER — DICLOFENAC SODIUM 75 MG PO TBEC: 75.0000 mg | DELAYED_RELEASE_TABLET | Freq: Two times a day (BID) | ORAL | 0 refills | Status: DC

## 2016-02-25 MED ORDER — METHYLPREDNISOLONE 4 MG PO TBPK: ORAL_TABLET | ORAL | 0 refills | Status: DC

## 2016-02-25 MED ORDER — TRIAMCINOLONE ACETONIDE 40 MG/ML IJ SUSP: 20.0000 mg | Freq: Once | INTRAMUSCULAR | Status: AC

## 2016-02-25 NOTE — Progress Notes (Signed)
Patient ID: Shawn Simpson, male   DOB: Aug 08, 1970, 46 y.o.   MRN: [id number]  Subjective: Shawn Simpson is a 46 y.o. male patient returns to office for follow up evaluation of bilateral heel pain. Reports has had flare up even after completing 4 sessions of EPAT with the right heel bothering him most states that he has been continuing doing stretching, icing, wearing brace and all things that has been previously taught. Patient denies any other pedal complaints.   There are no active problems to display for this patient.  Current Outpatient Prescriptions on File Prior to Visit  Medication Sig Dispense Refill  . ibuprofen (ADVIL,MOTRIN) 600 MG tablet Take 1 tablet (600 mg total) by mouth every 8 (eight) hours as needed. 30 tablet 0  . lovastatin (MEVACOR) 10 MG tablet Take 10 mg by mouth daily.  0  . meloxicam (MOBIC) 15 MG tablet      No current facility-administered medications on file prior to visit.    Allergies  Allergen Reactions  . Sulfa Antibiotics Hives     Objective: Physical Exam General: The patient is alert and oriented x3 in no acute distress.  Dermatology: Skin is warm, dry and supple bilateral lower extremities. Nails 1-10 are normal. There is no erythema, edema, no eccymosis, no open lesions present. Integument is otherwise unremarkable.  Vascular: Dorsalis Pedis pulse and Posterior Tibial pulse are 2/4 bilateral. Capillary fill time is immediate to all digits.  Neurological: Grossly intact to light touch with an achilles reflex of +2/5 and a negative Tinel's sign bilateral.  Musculoskeletal: There is Mild to palpation at the medial calcaneal tubercale and through the insertion of the plantar fascia on the right> left.  No pain with compression of calcaneus bilateral. No pain with tuning fork to calcaneus bilateral. No pain with calf compression bilateral. There is decreased Ankle joint range of motion bilateral, right>left. All other joints range of motion within  normal limits bilateral. Strength 5/5 in all groups bilateral.   Assessment and Plan: Problem List Items Addressed This Visit    None    Visit Diagnoses    Plantar fasciitis of right foot    -  Primary   Relevant Medications   triamcinolone acetonide (KENALOG-40) injection 20 mg (Start on 02/25/2016 10:00 AM)   methylPREDNISolone (MEDROL DOSEPAK) 4 MG TBPK tablet   diclofenac (VOLTAREN) 75 MG EC tablet   Plantar fasciitis, left       Relevant Medications   triamcinolone acetonide (KENALOG-40) injection 20 mg (Start on 02/25/2016 10:00 AM)   methylPREDNISolone (MEDROL DOSEPAK) 4 MG TBPK tablet   diclofenac (VOLTAREN) 75 MG EC tablet   Acquired equinus deformity of both feet       Relevant Medications   triamcinolone acetonide (KENALOG-40) injection 20 mg (Start on 02/25/2016 10:00 AM)   methylPREDNISolone (MEDROL DOSEPAK) 4 MG TBPK tablet   diclofenac (VOLTAREN) 75 MG EC tablet     -Complete examination performed. Re-Discussed with patient in detail the condition of plantar fasciitis with equinus, how this occurs and general treatment options. Explained both conservative and surgical treatments.  -After oral consent and aseptic prep, injected a mixture containing 1 ml of 2% plain lidocaine, 1 ml 0.5% plain marcaine, 0.5 ml of kenalog 40 and 0.5 ml of dexamethasone phosphate into Right and left without complication. Post-injection care discussed with patient.  -Rx Medrol dose pack and Diclofenac to take as instructed -Continue topical compound cream to use daily -Recommended to cont with inserts and good supportive shoes -Recommend  continue with use of night splint -Continue daily stretching exercises and home exercises as taught by PT. -Recommend patient to cont to ice affected area 1-2x daily  -Re-discussed may benefit from gastroc release and endoscopic plantar fascial release counseled patient on etiology and significant equinus which is likely strongly contributing to continued symptoms.  Patient refuses surgery at this time. -Patient to return to office in 1 month or sooner if problems or questions arise.   Asencion Islamitorya Symphoni Helbling, DPM

## 2016-03-07 ENCOUNTER — Other Ambulatory Visit: Payer: Self-pay | Admitting: Sports Medicine

## 2016-03-07 DIAGNOSIS — M216X1 Other acquired deformities of right foot: Secondary | ICD-10-CM

## 2016-03-07 DIAGNOSIS — M216X2 Other acquired deformities of left foot: Secondary | ICD-10-CM

## 2016-03-07 DIAGNOSIS — M722 Plantar fascial fibromatosis: Secondary | ICD-10-CM

## 2016-03-20 ENCOUNTER — Other Ambulatory Visit: Payer: Self-pay | Admitting: Sports Medicine

## 2016-03-20 DIAGNOSIS — M216X2 Other acquired deformities of left foot: Secondary | ICD-10-CM

## 2016-03-20 DIAGNOSIS — M722 Plantar fascial fibromatosis: Secondary | ICD-10-CM

## 2016-03-20 DIAGNOSIS — M216X1 Other acquired deformities of right foot: Secondary | ICD-10-CM

## 2016-03-31 ENCOUNTER — Ambulatory Visit: Payer: Federal, State, Local not specified - PPO | Admitting: Sports Medicine

## 2016-04-02 ENCOUNTER — Other Ambulatory Visit: Payer: Self-pay | Admitting: Sports Medicine

## 2016-04-02 DIAGNOSIS — M216X2 Other acquired deformities of left foot: Secondary | ICD-10-CM

## 2016-04-02 DIAGNOSIS — M722 Plantar fascial fibromatosis: Secondary | ICD-10-CM

## 2016-04-02 DIAGNOSIS — M216X1 Other acquired deformities of right foot: Secondary | ICD-10-CM

## 2016-04-13 ENCOUNTER — Encounter: Payer: Self-pay | Admitting: Sports Medicine

## 2016-04-13 ENCOUNTER — Ambulatory Visit (INDEPENDENT_AMBULATORY_CARE_PROVIDER_SITE_OTHER): Payer: Federal, State, Local not specified - PPO | Admitting: Sports Medicine

## 2016-04-13 DIAGNOSIS — M722 Plantar fascial fibromatosis: Secondary | ICD-10-CM | POA: Diagnosis not present

## 2016-04-13 DIAGNOSIS — M216X2 Other acquired deformities of left foot: Secondary | ICD-10-CM | POA: Diagnosis not present

## 2016-04-13 DIAGNOSIS — M216X1 Other acquired deformities of right foot: Secondary | ICD-10-CM

## 2016-04-13 NOTE — Progress Notes (Signed)
Subjective: Shawn Simpson is a 46 y.o. male returns to office for follow up evaluation after Left/Right heel injection for plantar fasciitis, injection #1 administered 6 weeks ago. Patient states that the injection seems to help his pain; pain is now resolved and has return to exercise with no issues. Patient denies any recent changes in medications or new problems since last visit.   There are no active problems to display for this patient.   Current Outpatient Prescriptions on File Prior to Visit  Medication Sig Dispense Refill  . diclofenac (VOLTAREN) 75 MG EC tablet TAKE 1 TABLET (75 MG TOTAL) BY MOUTH 2 (TWO) TIMES DAILY. 30 tablet 0  . ibuprofen (ADVIL,MOTRIN) 600 MG tablet Take 1 tablet (600 mg total) by mouth every 8 (eight) hours as needed. 30 tablet 0  . lovastatin (MEVACOR) 10 MG tablet Take 10 mg by mouth daily.  0  . meloxicam (MOBIC) 15 MG tablet     . methylPREDNISolone (MEDROL DOSEPAK) 4 MG TBPK tablet Take 1st as instructed 21 tablet 0   Current Facility-Administered Medications on File Prior to Visit  Medication Dose Route Frequency Provider Last Rate Last Dose  . triamcinolone acetonide (KENALOG-40) injection 20 mg  20 mg Other Once Asencion Islamitorya Eleora Sutherland, DPM        Allergies  Allergen Reactions  . Sulfa Antibiotics Hives    Objective:   General:  Alert and oriented x 3, in no acute distress  Dermatology: Skin is warm, dry, and supple bilateral. Nails are within normal limits. There is no lower extremity erythema, no eccymosis, no open lesions present bilateral.   Vascular: Dorsalis Pedis and Posterior Tibial pedal pulses are /4 bilateral. + hair growth noted bilateral. Capillary Fill Time is 3 seconds in all digits. No varicosities, No edema bilateral lower extremities.   Neurological: Sensation grossly intact to light touch with an achilles reflex of +2 and a negative Tinel's sign bilateral. Vibratory, sharp/dull, Semmes Weinstein Monofilament within normal limits.    Musculoskeletal: There is no tenderness to palpation at the medial calcaneal tubercale and through the insertion of the plantar fascia bilateral. No pain with compression to calcaneus or application of tuning fork. There is decreased Ankle joint range of motion bilateral. All other joints range of motion  within normal limits bilateral. Strength 5/5 bilateral.   Assessment and Plan: Problem List Items Addressed This Visit    None    Visit Diagnoses    Plantar fasciitis of right foot    -  Primary   Plantar fasciitis, left       Acquired equinus deformity of both feet          -Complete examination performed.  -Previous x-rays reviewed. -Discussed with patient in detail the condition of plantar fasciitis, how this  occurs related to the foot type of the patient and general treatment options. -Continue with Diclofenac until completed  -Continue with stretching, icing, good supportive shoes, inserts daily.  -Discussed long term care and reocurrence; will closely monitor; if fails to improve will consider other treatment modalities.  -Patient to return to office as needed or sooner if problems or questions arise.  Asencion Islamitorya Brianni Manthe, DPM

## 2016-09-06 ENCOUNTER — Ambulatory Visit: Payer: Federal, State, Local not specified - PPO | Admitting: Sports Medicine

## 2016-09-06 ENCOUNTER — Ambulatory Visit (INDEPENDENT_AMBULATORY_CARE_PROVIDER_SITE_OTHER): Payer: Federal, State, Local not specified - PPO | Admitting: Sports Medicine

## 2016-09-06 DIAGNOSIS — M722 Plantar fascial fibromatosis: Secondary | ICD-10-CM

## 2016-09-06 DIAGNOSIS — R269 Unspecified abnormalities of gait and mobility: Secondary | ICD-10-CM

## 2016-09-06 DIAGNOSIS — M216X1 Other acquired deformities of right foot: Secondary | ICD-10-CM

## 2016-09-06 DIAGNOSIS — M216X2 Other acquired deformities of left foot: Secondary | ICD-10-CM

## 2016-09-06 MED ORDER — TRIAMCINOLONE ACETONIDE 10 MG/ML IJ SUSP: 10.0000 mg | Freq: Once | INTRAMUSCULAR | Status: AC

## 2016-09-06 NOTE — Progress Notes (Signed)
Subjective: Shawn Simpson is a 46 y.o. male returns to office for follow up evaluation of heel pain. Reports no pain on left but a little pain on the right 4-5/10 and noticing that on his right foot his leg is turning out and he is wearing the ball of his shoes out. Patient denies any other problems or issues.   There are no active problems to display for this patient.   Current Outpatient Prescriptions on File Prior to Visit  Medication Sig Dispense Refill  . diclofenac (VOLTAREN) 75 MG EC tablet TAKE 1 TABLET (75 MG TOTAL) BY MOUTH 2 (TWO) TIMES DAILY. 30 tablet 0  . ibuprofen (ADVIL,MOTRIN) 600 MG tablet Take 1 tablet (600 mg total) by mouth every 8 (eight) hours as needed. 30 tablet 0  . lovastatin (MEVACOR) 10 MG tablet Take 10 mg by mouth daily.  0  . meloxicam (MOBIC) 15 MG tablet     . methylPREDNISolone (MEDROL DOSEPAK) 4 MG TBPK tablet Take 1st as instructed 21 tablet 0   Current Facility-Administered Medications on File Prior to Visit  Medication Dose Route Frequency Provider Last Rate Last Dose  . triamcinolone acetonide (KENALOG-40) injection 20 mg  20 mg Other Once [name], DPM        Allergies  Allergen Reactions  . Sulfa Antibiotics Hives    Objective:   General:  Alert and oriented x 3, in no acute distress  Dermatology: Skin is warm, dry, and supple bilateral. Nails are within normal limits. There is no lower extremity erythema, no eccymosis, no open lesions present bilateral.   Vascular: Dorsalis Pedis and Posterior Tibial pedal pulses are 2/4 bilateral. + hair growth noted bilateral. Capillary Fill Time is 3 seconds in all digits. No varicosities, No edema bilateral lower extremities.   Neurological: Sensation grossly intact to light touch with an achilles reflex of +2 and a negative Tinel's sign bilateral. Vibratory, sharp/dull, Semmes Weinstein Monofilament within normal limits.   Musculoskeletal: There is no tenderness to palpation at the medial  calcaneal tubercale and through the insertion of the plantar fascia on left however there is mild tenderness on the right. No pain with compression to calcaneus or application of tuning fork. There is decreased Ankle joint range of motion bilateral and hip range of motion on right. All other joints range of motion  within normal limits bilateral. Strength 5/5 bilateral.   Assessment and Plan: Problem List Items Addressed This Visit    None    Visit Diagnoses    Plantar fasciitis of right foot    -  Primary   Relevant Medications   triamcinolone acetonide (KENALOG) 10 MG/ML injection 10 mg (Start on 09/06/2016  7:45 PM)   Acquired equinus deformity of both feet       Abnormality of gait          -Complete examination performed.  -Discussed with patient in detail the condition of plantar fasciitis, how this  occurs related to the foot type of the patient and general treatment options. -After oral consent and aseptic prep, injected a mixture containing 1 ml of 2%  plain lidocaine, 1 ml 0.5% plain marcaine, 0.5 ml of kenalog 10 and 0.5 ml of dexamethasone phosphate into right heel at plantar fascia without complication. Post-injection care discussed with patient.   -Rx ASO brace -Recommend hip stretches  -Continue with night splint, icing, good supportive shoes, inserts daily.  -Discussed long term care and reocurrence; will closely monitor; if fails to improve will consider other  treatment modalities.  -Patient to return to office as needed or sooner if problems or questions arise.  Asencion Islamitorya Jospeh Mangel, DPM

## 2017-03-14 ENCOUNTER — Encounter: Payer: Self-pay | Admitting: Sports Medicine

## 2017-03-14 ENCOUNTER — Ambulatory Visit (INDEPENDENT_AMBULATORY_CARE_PROVIDER_SITE_OTHER): Payer: Federal, State, Local not specified - PPO

## 2017-03-14 ENCOUNTER — Ambulatory Visit: Payer: Federal, State, Local not specified - PPO | Admitting: Sports Medicine

## 2017-03-14 DIAGNOSIS — S96911A Strain of unspecified muscle and tendon at ankle and foot level, right foot, initial encounter: Secondary | ICD-10-CM

## 2017-03-14 DIAGNOSIS — M216X1 Other acquired deformities of right foot: Secondary | ICD-10-CM

## 2017-03-14 DIAGNOSIS — M722 Plantar fascial fibromatosis: Secondary | ICD-10-CM

## 2017-03-14 DIAGNOSIS — M216X2 Other acquired deformities of left foot: Secondary | ICD-10-CM

## 2017-03-14 DIAGNOSIS — R269 Unspecified abnormalities of gait and mobility: Secondary | ICD-10-CM

## 2017-03-14 MED ORDER — TRAMADOL HCL 50 MG PO TABS: 50.0000 mg | ORAL_TABLET | Freq: Three times a day (TID) | ORAL | 0 refills | Status: DC | PRN

## 2017-03-14 NOTE — Progress Notes (Signed)
Subjective: Shawn Simpson is a 47 y.o. male returns to office for follow up evaluation of new pain to top of right foot and heel pain. Reports no pain on left but a little continued heel pain on the right and new pain at top of right foot that started after stretching really quickly that resulted in him feeling a pull that has caused some numbness to the top of the foot. Was seen at urgent care where they gave prednisone and also antibiotics for other cold symptoms that he was having; finished steroids yesterday. Patient denies any other problems or issues.   There are no active problems to display for this patient.   Current Outpatient Medications on File Prior to Visit  Medication Sig Dispense Refill  . diclofenac (VOLTAREN) 75 MG EC tablet TAKE 1 TABLET (75 MG TOTAL) BY MOUTH 2 (TWO) TIMES DAILY. 30 tablet 0  . ibuprofen (ADVIL,MOTRIN) 600 MG tablet Take 1 tablet (600 mg total) by mouth every 8 (eight) hours as needed. 30 tablet 0  . lovastatin (MEVACOR) 10 MG tablet Take 10 mg by mouth daily.  0  . meloxicam (MOBIC) 15 MG tablet     . methylPREDNISolone (MEDROL DOSEPAK) 4 MG TBPK tablet Take 1st as instructed 21 tablet 0   Current Facility-Administered Medications on File Prior to Visit  Medication Dose Route Frequency Provider Last Rate Last Dose  . triamcinolone acetonide (KENALOG) 10 MG/ML injection 10 mg  10 mg Other Once [place], Valrie Jia, DPM      . triamcinolone acetonide (KENALOG-40) injection 20 mg  20 mg Other Once Asencion IslamStover, Montrice Montuori, DPM        Allergies  Allergen Reactions  . Sulfa Antibiotics Hives    Objective:   General:  Alert and oriented x 3, in no acute distress  Dermatology: Skin is warm, dry, and supple bilateral. Nails are within normal limits. There is no lower extremity erythema, no eccymosis, no open lesions present bilateral.   Vascular: Dorsalis Pedis and Posterior Tibial pedal pulses are 2/4 bilateral. + hair growth noted bilateral. Capillary Fill Time is 3  seconds in all digits. No varicosities, No edema bilateral lower extremities.   Neurological: Sensation grossly intact to light touch with an achilles reflex of +2 and a negative Tinel's sign bilateral. Vibratory, sharp/dull, Semmes Weinstein Monofilament within normal limits. Burning sensation to top of right foot.   Musculoskeletal: There is no tenderness to palpation at the medial calcaneal tubercale and through the insertion of the plantar fascia on left however there is mild tenderness on the right. Pain with palpation along extensor tendons on right foot from the 2-3rd MTPJs to midtarsal joint. No pain with compression to calcaneus or application of tuning fork. There is decreased Ankle joint range of motion bilateral and hip range of motion on right. All other joints range of motion  within normal limits bilateral. Strength 5/5 bilateral.   Xray, Right foot- inferior heel spur no other acute findings   Assessment and Plan: Problem List Items Addressed This Visit    None    Visit Diagnoses    Tear of tendon of right foot, initial encounter    -  Primary   Plantar fasciitis of right foot       Relevant Orders   DG Foot Complete Right (Completed)   Acquired equinus deformity of both feet       Abnormality of gait         -Complete examination performed.  -Xray right foot  -Discussed  with patient in detail the condition of plantar fasciitis chronic and new possible tendon tear -Applied soft cast and dispensed cam boot recommend no work for 2 weeks; Advised patient to stay off foot as much as possible -Ordered MRI for further eval of possible tendon tear  -Rx Tramadol for pain  -Continue with icing daily  -Patient to return to office after MRI or sooner if problems or questions arise.  [name], DPM

## 2017-03-14 NOTE — Patient Instructions (Signed)
SOFT CAST/ UNNA BOOT INSTRUCTIONS  Unna boot needs to be worn for 5 days for maximum benefit.  If you next appointment is before 5 days, please remove prior to coming in.  It is important that you wear sturdy shoe or walking boot at all times when walking to protect cast.    **If at any time while wearing the unna boot you should notice any irritation such as a rash, redness, or itching, remove the boot and wash your foot/feet thoroughly.  BATHING INSTRUCTIONS  Keep soft cast clean and dry. If wet, pat dry and blow dry with hair dryer and call office for further instructions.   

## 2017-03-15 ENCOUNTER — Telehealth: Payer: Self-pay | Admitting: *Deleted

## 2017-03-15 DIAGNOSIS — M722 Plantar fascial fibromatosis: Secondary | ICD-10-CM

## 2017-03-15 DIAGNOSIS — S96911A Strain of unspecified muscle and tendon at ankle and foot level, right foot, initial encounter: Secondary | ICD-10-CM

## 2017-03-15 NOTE — Telephone Encounter (Signed)
-----   Message from [place], [place] sent at 03/14/2017  9:40 AM EST ----- Regarding: MRI R Foot  Pain after stretching R/o tendon tear at extensors dorsal midfoot

## 2017-03-15 NOTE — Telephone Encounter (Signed)
Orders to J. Quintana, RN for pre-cert. 

## 2017-03-16 NOTE — Telephone Encounter (Signed)
Shawn Simpson scheduled pt 03/19/2017 at 4:30pm and arrive 4:15pm. Faxed orders to [facility][name].

## 2017-03-16 NOTE — Telephone Encounter (Signed)
I informed pt of MRI 03/19/2017.

## 2017-03-21 ENCOUNTER — Telehealth: Payer: Self-pay | Admitting: *Deleted

## 2017-03-21 ENCOUNTER — Encounter: Payer: Self-pay | Admitting: Sports Medicine

## 2017-03-21 NOTE — Telephone Encounter (Signed)
Spoke with patient informed he has stress fracture and to wear walking boot patient stated understanding.  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  Scheduled 3 week follow up for x-rays to check healing.

## 2017-03-21 NOTE — Telephone Encounter (Signed)
-----   Message from [place], [place] sent at 03/21/2017 11:43 AM EST ----- Shawn Simpson  Can you let patient know that his MRI shows a stress fracture at the 2nd metatarsal. He should stay in his CAM boot and we should get him back to office in 3 week for repeat xray to see if we can see any healing of the stress fracture Thanks Dr. Marylene LandStover

## 2017-03-30 ENCOUNTER — Ambulatory Visit: Payer: Federal, State, Local not specified - PPO | Admitting: Sports Medicine

## 2017-04-13 ENCOUNTER — Ambulatory Visit (INDEPENDENT_AMBULATORY_CARE_PROVIDER_SITE_OTHER): Payer: Federal, State, Local not specified - PPO | Admitting: Sports Medicine

## 2017-04-13 ENCOUNTER — Ambulatory Visit (INDEPENDENT_AMBULATORY_CARE_PROVIDER_SITE_OTHER): Payer: Federal, State, Local not specified - PPO

## 2017-04-13 ENCOUNTER — Encounter: Payer: Self-pay | Admitting: Sports Medicine

## 2017-04-13 DIAGNOSIS — S92321D Displaced fracture of second metatarsal bone, right foot, subsequent encounter for fracture with routine healing: Secondary | ICD-10-CM | POA: Diagnosis not present

## 2017-04-13 DIAGNOSIS — M84374D Stress fracture, right foot, subsequent encounter for fracture with routine healing: Secondary | ICD-10-CM

## 2017-04-13 DIAGNOSIS — M79671 Pain in right foot: Secondary | ICD-10-CM | POA: Diagnosis not present

## 2017-04-13 NOTE — Progress Notes (Signed)
Subjective: Shawn Simpson is a 47 y.o. male patient who presents to office for evaluation of Right foot pain at 2nd metatarsal and dorsal midfoot secondary to stress fracture found on MRI 03-19-17. Patient is wearing CAM boot states that pain is slowly getting better has 1 sore spot. Admits to a little tenderness as previous on plantar fascia on right. Patient denies any other pedal complaints.   There are no active problems to display for this patient.   Current Outpatient Medications on File Prior to Visit  Medication Sig Dispense Refill  . diclofenac (VOLTAREN) 75 MG EC tablet TAKE 1 TABLET (75 MG TOTAL) BY MOUTH 2 (TWO) TIMES DAILY. 30 tablet 0  . ibuprofen (ADVIL,MOTRIN) 600 MG tablet Take 1 tablet (600 mg total) by mouth every 8 (eight) hours as needed. 30 tablet 0  . lovastatin (MEVACOR) 10 MG tablet Take 10 mg by mouth daily.  0  . meloxicam (MOBIC) 15 MG tablet     . methylPREDNISolone (MEDROL DOSEPAK) 4 MG TBPK tablet Take 1st as instructed 21 tablet 0  . traMADol (ULTRAM) 50 MG tablet Take 1 tablet (50 mg total) by mouth every 8 (eight) hours as needed. 21 tablet 0   Current Facility-Administered Medications on File Prior to Visit  Medication Dose Route Frequency Provider Last Rate Last Dose  . triamcinolone acetonide (KENALOG) 10 MG/ML injection 10 mg  10 mg Other Once Bridger, Nelsy Madonna, DPM      . triamcinolone acetonide (KENALOG-40) injection 20 mg  20 mg Other Once Landis Martins, DPM        Allergies  Allergen Reactions  . Sulfa Antibiotics Hives    Objective:  General: Alert and oriented x3 in no acute distress  Dermatology: No open lesions bilateral lower extremities, no webspace macerations, no ecchymosis bilateral, all nails x 10 are well manicured.  Vascular: Focal edema noted to right dorsal midfoot Dorsalis Pedis and Posterior Tibial pedal pulses 2/4, Capillary Fill Time 3 seconds,(+) pedal hair growth bilateral, Temperature gradient within normal  limits.  Neurology: Johney Maine sensation intact via light touch bilateral.  Musculoskeletal: There is tenderness with palpation at 2nd met shaft on right, There is mild tenderness along plantar fascia on right. No pain with calf compression bilateral. All joint range of motion is within normal limits except calf where there is equinus noted, Strength within normal limits in all groups bilateral.   Gait: protected with CAM boot  Xrays  Right Foot   Impression: 2nd metatarsal healing with osteoblast reaction, Unchanged heel spur, mild soft tissue swelling. No other acute findings.     Assessment and Plan: Problem List Items Addressed This Visit    None    Visit Diagnoses    Closed fracture of second metatarsal bone of right foot with routine healing, physeal involvement unspecified, subsequent encounter    -  Primary   Relevant Orders   DG Foot Complete Right   Stress fracture of metatarsal bone of right foot with routine healing, subsequent encounter       Right foot pain       Relevant Orders   DG Foot Complete Right       -Complete examination performed -Xrays reviewed -Discussed continued care for stress fracture; risks, alternatives, and benefits explained. -Continue CAM walker to patient to wear at all times and instructed again on use -Recommend protection, compression sleeve, rest, ice, elevation daily until symptoms improve -Recommend Tylenol as needed for pain  -Patient may do NWB stretching/rolling pin for calf -Work  excuse given  -Patient to return to office in 4 weeks for serial x-rays to assess healing or sooner if condition worsens.  Landis Martins, DPM

## 2017-05-11 ENCOUNTER — Ambulatory Visit (INDEPENDENT_AMBULATORY_CARE_PROVIDER_SITE_OTHER): Payer: Federal, State, Local not specified - PPO | Admitting: Sports Medicine

## 2017-05-11 ENCOUNTER — Encounter: Payer: Self-pay | Admitting: Sports Medicine

## 2017-05-11 ENCOUNTER — Other Ambulatory Visit: Payer: Self-pay | Admitting: Sports Medicine

## 2017-05-11 ENCOUNTER — Ambulatory Visit (INDEPENDENT_AMBULATORY_CARE_PROVIDER_SITE_OTHER): Payer: Federal, State, Local not specified - PPO

## 2017-05-11 DIAGNOSIS — M84374D Stress fracture, right foot, subsequent encounter for fracture with routine healing: Secondary | ICD-10-CM

## 2017-05-11 DIAGNOSIS — S92321D Displaced fracture of second metatarsal bone, right foot, subsequent encounter for fracture with routine healing: Secondary | ICD-10-CM

## 2017-05-11 DIAGNOSIS — M79671 Pain in right foot: Secondary | ICD-10-CM

## 2017-05-11 NOTE — Progress Notes (Signed)
Subjective: Shawn Simpson is a 47 y.o. male patient who presents to office for evaluation of Right foot pain at 2nd metatarsal and dorsal midfoot secondary to stress fracture found on MRI 03-19-17. Patient is wearing CAM boot states that pain is better now 2 out of 10 occasionally. Patient denies any other pedal complaints.   There are no active problems to display for this patient.   Current Outpatient Medications on File Prior to Visit  Medication Sig Dispense Refill  . diclofenac (VOLTAREN) 75 MG EC tablet TAKE 1 TABLET (75 MG TOTAL) BY MOUTH 2 (TWO) TIMES DAILY. 30 tablet 0  . ibuprofen (ADVIL,MOTRIN) 600 MG tablet Take 1 tablet (600 mg total) by mouth every 8 (eight) hours as needed. 30 tablet 0  . lovastatin (MEVACOR) 10 MG tablet Take 10 mg by mouth daily.  0  . meloxicam (MOBIC) 15 MG tablet     . methylPREDNISolone (MEDROL DOSEPAK) 4 MG TBPK tablet Take 1st as instructed 21 tablet 0  . traMADol (ULTRAM) 50 MG tablet Take 1 tablet (50 mg total) by mouth every 8 (eight) hours as needed. 21 tablet 0   Current Facility-Administered Medications on File Prior to Visit  Medication Dose Route Frequency Provider Last Rate Last Dose  . triamcinolone acetonide (KENALOG) 10 MG/ML injection 10 mg  10 mg Other Once Stover, Titorya, DPM      . triamcinolone acetonide (KENALOG-40) injection 20 mg  20 mg Other Once Stover, Titorya, DPM        Allergies  Allergen Reactions  . Sulfa Antibiotics Hives    Objective:  General: Alert and oriented x3 in no acute distress  Dermatology: No open lesions bilateral lower extremities, no webspace macerations, no ecchymosis bilateral, all nails x 10 are well manicured.  Vascular: Resolved focal edema noted to right dorsal midfoot Dorsalis Pedis and Posterior Tibial pedal pulses 2/4, Capillary Fill Time 3 seconds,(+) pedal hair growth bilateral, Temperature gradient within normal limits.  Neurology: Gross sensation intact via light touch  bilateral.  Musculoskeletal: There is minimal tenderness with palpation at 2nd met shaft on right, There is minimal tenderness along plantar fascia on right.  Subjective episode of pain at the great toe joint when he attempted to walk without his cam boot.  No pain with calf compression bilateral. All joint range of motion is within normal limits except calf where there is equinus noted and mild early limitus at first MPJ on right, Strength within normal limits in all groups bilateral.   Gait: protected with CAM boot  Xrays  Right Foot   Impression: 2nd metatarsal healing with osteoblast reaction, 85-90% improved, Unchanged heel spur, possible very early first MPJ arthritis, mild soft tissue swelling. No other acute findings.     Assessment and Plan: Problem List Items Addressed This Visit    None    Visit Diagnoses    Stress fracture of metatarsal bone of right foot with routine healing, subsequent encounter    -  Primary   Closed fracture of second metatarsal bone of right foot with routine healing, physeal involvement unspecified, subsequent encounter       Relevant Orders   DG Foot Complete Right   Right foot pain           -Complete examination performed -Xrays reviewed -Discussed continued care for stress fracture; risks, alternatives, and benefits explained. -Continue CAM walker to patient to wear at all times and instructed again on use for the next 2 weeks after 2 weeks may slowly   wean to a stiff soled tennis shoe or boot -Recommend protection, compression sleeve, rest, ice, elevation daily until symptoms improve -Recommend Tylenol as needed for pain  -Patient may do NWB stretching/rolling pin for calf or low impact exercise -Work excuse given  -Patient to return to office in 4 weeks for serial x-rays to assess healing or sooner if condition worsens.  Landis Martins, DPM

## 2017-06-07 ENCOUNTER — Other Ambulatory Visit: Payer: Self-pay | Admitting: Sports Medicine

## 2017-06-07 ENCOUNTER — Ambulatory Visit (INDEPENDENT_AMBULATORY_CARE_PROVIDER_SITE_OTHER): Payer: Federal, State, Local not specified - PPO

## 2017-06-07 ENCOUNTER — Encounter: Payer: Self-pay | Admitting: Sports Medicine

## 2017-06-07 ENCOUNTER — Ambulatory Visit (INDEPENDENT_AMBULATORY_CARE_PROVIDER_SITE_OTHER): Payer: Federal, State, Local not specified - PPO | Admitting: Sports Medicine

## 2017-06-07 DIAGNOSIS — S92321D Displaced fracture of second metatarsal bone, right foot, subsequent encounter for fracture with routine healing: Secondary | ICD-10-CM

## 2017-06-07 DIAGNOSIS — M84374D Stress fracture, right foot, subsequent encounter for fracture with routine healing: Secondary | ICD-10-CM

## 2017-06-07 DIAGNOSIS — M79671 Pain in right foot: Secondary | ICD-10-CM | POA: Diagnosis not present

## 2017-06-07 DIAGNOSIS — M722 Plantar fascial fibromatosis: Secondary | ICD-10-CM | POA: Diagnosis not present

## 2017-06-07 NOTE — Progress Notes (Signed)
Subjective: Shawn Simpson is a 47 y.o. male patient who presents to office for evaluation of Right foot pain at 2nd metatarsal and dorsal midfoot secondary to stress fracture found on MRI 03-19-17.  Patient is wearing stiff cycling shoes with no problems or issues however has noticed a little more pain in his heel along the area of his plantar fascia feels like needles sensations when he takes his shoes off and goes to bed at night.  Patient thinks that it is from him compensating walking differently since he is in a negative drop shoe.  Patient denies swelling at fracture site, warmth, erythema, or any other pain or other pedal complaints.   There are no active problems to display for this patient.   Current Outpatient Medications on File Prior to Visit  Medication Sig Dispense Refill  . lovastatin (MEVACOR) 10 MG tablet Take 10 mg by mouth daily.  0  . meloxicam (MOBIC) 15 MG tablet      Current Facility-Administered Medications on File Prior to Visit  Medication Dose Route Frequency Provider Last Rate Last Dose  . triamcinolone acetonide (KENALOG) 10 MG/ML injection 10 mg  10 mg Other Once Sheridan, Josia Cueva, DPM      . triamcinolone acetonide (KENALOG-40) injection 20 mg  20 mg Other Once Landis Martins, DPM        Allergies  Allergen Reactions  . Sulfa Antibiotics Hives    Objective:  General: Alert and oriented x3 in no acute distress  Dermatology: No open lesions bilateral lower extremities, no webspace macerations, no ecchymosis bilateral, all nails x 10 are well manicured.  Vascular: No focal edema noted to right dorsal midfoot, dorsalis Pedis and Posterior Tibial pedal pulses 2/4, Capillary Fill Time 3 seconds,(+) pedal hair growth bilateral, Temperature gradient within normal limits.  Neurology: Johney Maine sensation intact via light touch bilateral.  Musculoskeletal: There is no tenderness with palpation at 2nd met shaft on right, There is minimal tenderness along plantar fascia on  right.  Subjective pins and needles to the area.  No pain with calf compression bilateral. All joint range of motion is within normal limits except calf where there is equinus noted and mild early limitus at first MPJ on right, Strength within normal limits in all groups bilateral.   Xrays  Right Foot   Impression: 2nd metatarsal healing with osteoblast reaction, 95% improved, Unchanged heel spur, possible very early first MPJ arthritis, mild soft tissue swelling. No other acute findings.     Assessment and Plan: Problem List Items Addressed This Visit    None    Visit Diagnoses    Closed fracture of second metatarsal bone of right foot with routine healing, physeal involvement unspecified, subsequent encounter    -  Primary   Relevant Orders   DG Foot Complete Right   Stress fracture of metatarsal bone of right foot with routine healing, subsequent encounter       Right foot pain       Plantar fasciitis of right foot           -Complete examination performed -Xrays reviewed -Discussed continued care for almost completely resolved stress fracture; risks, alternatives, and benefits explained. -Continue with stiff sole cycling shoes for at least 1 more month before trying to venture to use other shoes -Recommend protection, compression sleeve, rest, ice, elevation daily until symptoms improve -Recommend Tylenol as needed for pain  -Patient may do NWB stretching/rolling pin for calf or low impact exercise for plantar fascia advised patient if  starts to flare again may benefit from a steroid injection versus taping -Patient to return to call office for appointment if pain worsens after 1 month when he goes to try other shoes or sooner if condition worsens.  Landis Martins, DPM

## 2017-07-10 DIAGNOSIS — R457 State of emotional shock and stress, unspecified: Secondary | ICD-10-CM | POA: Insufficient documentation

## 2017-08-08 ENCOUNTER — Ambulatory Visit (INDEPENDENT_AMBULATORY_CARE_PROVIDER_SITE_OTHER): Payer: Federal, State, Local not specified - PPO

## 2017-08-08 ENCOUNTER — Ambulatory Visit (INDEPENDENT_AMBULATORY_CARE_PROVIDER_SITE_OTHER): Payer: Federal, State, Local not specified - PPO | Admitting: Sports Medicine

## 2017-08-08 DIAGNOSIS — M779 Enthesopathy, unspecified: Secondary | ICD-10-CM | POA: Diagnosis not present

## 2017-08-08 DIAGNOSIS — M79671 Pain in right foot: Secondary | ICD-10-CM

## 2017-08-08 DIAGNOSIS — M216X2 Other acquired deformities of left foot: Secondary | ICD-10-CM

## 2017-08-08 DIAGNOSIS — R269 Unspecified abnormalities of gait and mobility: Secondary | ICD-10-CM

## 2017-08-08 DIAGNOSIS — S92321D Displaced fracture of second metatarsal bone, right foot, subsequent encounter for fracture with routine healing: Secondary | ICD-10-CM

## 2017-08-08 DIAGNOSIS — M216X1 Other acquired deformities of right foot: Secondary | ICD-10-CM

## 2017-08-08 DIAGNOSIS — M84374D Stress fracture, right foot, subsequent encounter for fracture with routine healing: Secondary | ICD-10-CM

## 2017-08-08 DIAGNOSIS — M722 Plantar fascial fibromatosis: Secondary | ICD-10-CM

## 2017-08-08 NOTE — Progress Notes (Signed)
Subjective: Shawn Simpson is a 47 y.o. male patient who presents to office for evaluation of Right foot pain.  Patient states that he is coming here on the side of caution because the other week he did take a misstep from his truck and noticed that the foot was swollen and a little sore took ibuprofen which helped and states that walking with his stiff boots has also help but he wanted to have his foot checked to make sure he did not injure the at area that was recently healing from a fracture.  Patient denies warmth, erythema, or any other pain or other pedal complaints.   Reports overall that his foot is doing okay and his plantar fascia is doing okay and he thinks that his new boots are helping.  There are no active problems to display for this patient.   Current Outpatient Medications on File Prior to Visit  Medication Sig Dispense Refill  . lovastatin (MEVACOR) 10 MG tablet Take 10 mg by mouth daily.  0  . meloxicam (MOBIC) 15 MG tablet      Current Facility-Administered Medications on File Prior to Visit  Medication Dose Route Frequency Provider Last Rate Last Dose  . triamcinolone acetonide (KENALOG) 10 MG/ML injection 10 mg  10 mg Other Once Green, Witt Plitt, DPM      . triamcinolone acetonide (KENALOG-40) injection 20 mg  20 mg Other Once Landis Martins, DPM        Allergies  Allergen Reactions  . Sulfa Antibiotics Hives    Objective:  General: Alert and oriented x3 in no acute distress  Dermatology: No open lesions bilateral lower extremities, no webspace macerations, no ecchymosis bilateral, all nails x 10 are well manicured.  Vascular: No focal edema noted to right dorsal midfoot, dorsalis Pedis and Posterior Tibial pedal pulses 2/4, Capillary Fill Time 3 seconds,(+) pedal hair growth bilateral, Temperature gradient within normal limits.  Neurology: Johney Maine sensation intact via light touch bilateral.  Musculoskeletal: There is no tenderness with palpation at 2nd met shaft  on right, There is minimal tenderness along plantar fascia on right.  Subjective pins and needles to the area.  No pain with calf compression bilateral. All joint range of motion is within normal limits except calf where there is equinus noted and mild early limitus at first MPJ on right, Strength within normal limits in all groups bilateral.   Xrays  Right Foot   Impression: 2nd metatarsal remains healed with no new fractures noted. Unchanged heel spur, possible very early first MPJ arthritis, mild soft tissue swelling. No other acute findings.     Assessment and Plan: Problem List Items Addressed This Visit    None    Visit Diagnoses    Closed fracture of second metatarsal bone of right foot with routine healing, physeal involvement unspecified, subsequent encounter    -  Primary   Relevant Orders   DG Foot Complete Right   Stress fracture of metatarsal bone of right foot with routine healing, subsequent encounter       Relevant Orders   DG Foot Complete Right   Plantar fasciitis of right foot       Relevant Orders   DG Foot Complete Right   Acquired equinus deformity of both feet       Relevant Orders   DG Foot Complete Right   Abnormality of gait       Relevant Orders   DG Foot Complete Right   Tendonitis  Relevant Orders   DG Foot Complete Right   Right foot pain           -Complete examination performed -Xrays reviewed -Discussed continued care for healed stress fracture with no new fracture noted; risks, alternatives, and benefits explained. -Continue with stiff soled shoe/boot -Recommend protection, compression sleeve, rest, ice, elevation daily until symptoms improve -Recommend Tylenol as needed for pain  -Continue with exercise to prevent plantarflexion from flaring up in good supportive shoes -Patient to return  as needed or sooner if condition worsens.  Landis Martins, DPM

## 2017-08-14 DIAGNOSIS — E669 Obesity, unspecified: Secondary | ICD-10-CM | POA: Insufficient documentation

## 2017-11-12 DIAGNOSIS — S46219A Strain of muscle, fascia and tendon of other parts of biceps, unspecified arm, initial encounter: Secondary | ICD-10-CM | POA: Insufficient documentation

## 2017-11-12 DIAGNOSIS — S46911A Strain of unspecified muscle, fascia and tendon at shoulder and upper arm level, right arm, initial encounter: Secondary | ICD-10-CM | POA: Insufficient documentation

## 2017-11-21 DIAGNOSIS — M25529 Pain in unspecified elbow: Secondary | ICD-10-CM | POA: Insufficient documentation

## 2017-11-22 ENCOUNTER — Other Ambulatory Visit: Payer: Self-pay | Admitting: Orthopedic Surgery

## 2017-11-22 DIAGNOSIS — S46212A Strain of muscle, fascia and tendon of other parts of biceps, left arm, initial encounter: Secondary | ICD-10-CM

## 2018-10-16 ENCOUNTER — Other Ambulatory Visit: Payer: Self-pay

## 2018-10-16 ENCOUNTER — Ambulatory Visit: Payer: Federal, State, Local not specified - PPO | Admitting: Sports Medicine

## 2018-10-16 ENCOUNTER — Other Ambulatory Visit: Payer: Self-pay | Admitting: Sports Medicine

## 2018-10-16 ENCOUNTER — Encounter: Payer: Self-pay | Admitting: Sports Medicine

## 2018-10-16 ENCOUNTER — Ambulatory Visit (INDEPENDENT_AMBULATORY_CARE_PROVIDER_SITE_OTHER): Payer: Federal, State, Local not specified - PPO

## 2018-10-16 VITALS — Temp 98.1°F | Resp 16

## 2018-10-16 DIAGNOSIS — M216X2 Other acquired deformities of left foot: Secondary | ICD-10-CM

## 2018-10-16 DIAGNOSIS — M722 Plantar fascial fibromatosis: Secondary | ICD-10-CM

## 2018-10-16 DIAGNOSIS — S92321D Displaced fracture of second metatarsal bone, right foot, subsequent encounter for fracture with routine healing: Secondary | ICD-10-CM | POA: Diagnosis not present

## 2018-10-16 DIAGNOSIS — M79671 Pain in right foot: Secondary | ICD-10-CM | POA: Diagnosis not present

## 2018-10-16 DIAGNOSIS — M216X1 Other acquired deformities of right foot: Secondary | ICD-10-CM | POA: Diagnosis not present

## 2018-10-16 NOTE — Progress Notes (Signed)
Subjective: Shawn Simpson is a 48 y.o. male patient who returns to office for follow-up evaluation of Right foot pain.  Patient states that he is coming here o to have his foot checked reports that he is curious to make sure that the fracture is healed every now and again gets a achy pain but otherwise is doing good.  Patient also reports that his plantar fascia is doing better and it just depends on how much he do on his feet if he has any type of soreness or pain and has figured out that he has to stay with good supportive shoes because when deviating his arches have to take the time to adjust which can be painful.  Patient denies any redness warmth swelling drainage nausea vomiting fever chills or any other constitutional symptoms at this time.  There are no active problems to display for this patient.   No current outpatient medications on file prior to visit.   Current Facility-Administered Medications on File Prior to Visit  Medication Dose Route Frequency Provider Last Rate Last Dose  . triamcinolone acetonide (KENALOG) 10 MG/ML injection 10 mg  10 mg Other Once [place], Joniyah Mallinger, DPM      . triamcinolone acetonide (KENALOG-40) injection 20 mg  20 mg Other Once Landis Martins, DPM        Allergies  Allergen Reactions  . Sulfa Antibiotics Hives  . Statins Other (See Comments)    Objective:  General: Alert and oriented x3 in no acute distress  Dermatology: No open lesions bilateral lower extremities, no webspace macerations, no ecchymosis bilateral, all nails x 10 are well manicured.  Vascular: No focal edema noted to right dorsal midfoot, dorsalis Pedis and Posterior Tibial pedal pulses 2/4, Capillary Fill Time 3 seconds,(+) pedal hair growth bilateral, Temperature gradient within normal limits.  Neurology: Johney Maine sensation intact via light touch bilateral.  Musculoskeletal: There is no tenderness with palpation at 2nd met shaft on right, There is no reproducible tenderness along  plantar fascia on right.  Subjective pulling or aching pain sometimes to the area.  No pain with calf compression bilateral. All joint range of motion is within normal limits except calf where there is equinus noted and mild early limitus at first MPJ on right unchanged from last exam, Strength within normal limits in all groups bilateral.   Xrays  Right Foot   Impression: 2nd metatarsal remains healed with no new fractures noted like before. Unchanged heel spur, possible very early first MPJ arthritis, mild soft tissue swelling. No other acute findings.     Assessment and Plan: Problem List Items Addressed This Visit    None    Visit Diagnoses    Closed fracture of second metatarsal bone of right foot with routine healing, physeal involvement unspecified, subsequent encounter    -  Primary   Remains healed   Plantar fasciitis of right foot       Acquired equinus deformity of both feet           -Complete examination performed -Xrays reviewed -Re-Discussed continued care for healed stress fracture with no new fracture noted -Recommend good supportive shoes daily for foot type -Continue with stretching to prevent reoccurrence of plantar fasciitis -Continue with good supportive shoes and over-the-counter insoles daily -Advised patient to closely monitor symptoms if anything worsens to return to office -Patient to return as needed or sooner if condition worsens.  Landis Martins, DPM

## 2018-10-21 ENCOUNTER — Other Ambulatory Visit: Payer: Self-pay | Admitting: Sports Medicine

## 2018-10-21 DIAGNOSIS — S92321D Displaced fracture of second metatarsal bone, right foot, subsequent encounter for fracture with routine healing: Secondary | ICD-10-CM

## 2018-10-21 DIAGNOSIS — M79671 Pain in right foot: Secondary | ICD-10-CM

## 2020-07-27 DIAGNOSIS — M19019 Primary osteoarthritis, unspecified shoulder: Secondary | ICD-10-CM | POA: Insufficient documentation

## 2020-09-09 DIAGNOSIS — N329 Bladder disorder, unspecified: Secondary | ICD-10-CM | POA: Insufficient documentation

## 2020-12-31 ENCOUNTER — Ambulatory Visit (INDEPENDENT_AMBULATORY_CARE_PROVIDER_SITE_OTHER): Payer: Federal, State, Local not specified - PPO

## 2020-12-31 ENCOUNTER — Ambulatory Visit: Payer: Federal, State, Local not specified - PPO | Admitting: Sports Medicine

## 2020-12-31 ENCOUNTER — Encounter: Payer: Self-pay | Admitting: Sports Medicine

## 2020-12-31 ENCOUNTER — Other Ambulatory Visit: Payer: Self-pay

## 2020-12-31 DIAGNOSIS — M79671 Pain in right foot: Secondary | ICD-10-CM

## 2020-12-31 DIAGNOSIS — M79672 Pain in left foot: Secondary | ICD-10-CM | POA: Diagnosis not present

## 2020-12-31 DIAGNOSIS — M216X1 Other acquired deformities of right foot: Secondary | ICD-10-CM | POA: Diagnosis not present

## 2020-12-31 DIAGNOSIS — M7751 Other enthesopathy of right foot: Secondary | ICD-10-CM

## 2020-12-31 DIAGNOSIS — M7752 Other enthesopathy of left foot: Secondary | ICD-10-CM

## 2020-12-31 DIAGNOSIS — M722 Plantar fascial fibromatosis: Secondary | ICD-10-CM | POA: Diagnosis not present

## 2020-12-31 DIAGNOSIS — M216X2 Other acquired deformities of left foot: Secondary | ICD-10-CM

## 2020-12-31 DIAGNOSIS — M7750 Other enthesopathy of unspecified foot: Secondary | ICD-10-CM

## 2020-12-31 MED ORDER — DICLOFENAC SODIUM 75 MG PO TBEC: 75.0000 mg | DELAYED_RELEASE_TABLET | Freq: Two times a day (BID) | ORAL | 0 refills | Status: AC

## 2020-12-31 NOTE — Progress Notes (Signed)
Subjective: Shawn Simpson is a 50 y.o. male returns to office for bilateral foot pain. Reports that his heels and arches are getting a little sore and states that sometimes he gets an achy pain over the top of the right foot at area of previous fracture especially when he loads the foot with work and states that he recently went back but was put out again because of his shoulder.   There are no problems to display for this patient.   No current outpatient medications on file prior to visit.   Current Facility-Administered Medications on File Prior to Visit  Medication Dose Route Frequency Provider Last Rate Last Admin   triamcinolone acetonide (KENALOG) 10 MG/ML injection 10 mg  10 mg Other Once Landis Martins, DPM       triamcinolone acetonide (KENALOG-40) injection 20 mg  20 mg Other Once Landis Martins, DPM        Allergies  Allergen Reactions   Sulfa Antibiotics Hives   Statins Other (See Comments)    Objective:   General:  Alert and oriented x 3, in no acute distress  Dermatology: Skin is warm, dry, and supple bilateral. Nails are within normal limits. There is no lower extremity erythema, no eccymosis, no open lesions present bilateral.   Vascular: Dorsalis Pedis and Posterior Tibial pedal pulses are 2/4 bilateral. + hair growth noted bilateral. Capillary Fill Time is 3 seconds in all digits. No varicosities, No edema bilateral lower extremities.   Neurological: Sensation grossly intact to light touch bilateral.  Musculoskeletal: There is tenderness to palpation at the medial calcaneal tubercale and through the insertion of the plantar fascia on the Left and right foot. There is decreased Ankle joint range of motion bilateral. All other jointsrange of motion  within normal limits bilateral. Strength 5/5 bilateral.   Assessment and Plan: Problem List Items Addressed This Visit   None Visit Diagnoses     Plantar fasciitis, bilateral    -  Primary   Relevant Orders   DG  Foot Complete Left   DG Foot Complete Right   Foot pain, bilateral       Acquired equinus deformity of both feet           -Complete examination performed.  -Previous x-rays reviewed. Right significant heel spur, old fracture at 2nd met shaft is well healed -Discussed with patient in detail the condition of plantar fasciitis, how this  occurs related to the foot type of the patient and general treatment options. -Dispensed night splint -Rx Diclofenac -Continue with stretching, icing, good supportive shoes, inserts daily.  -Discussed long term care and reocurrence; will closely monitor; if fails to improve will consider other treatment modalities.  -Work note provided no ladder, no pushing/pulling/lifting >100lbs -Patient to return to office in 4-6 weeks for follow up or sooner if problems or questions arise.  Landis Martins, DPM

## 2021-02-04 ENCOUNTER — Encounter: Payer: Self-pay | Admitting: Sports Medicine

## 2021-02-04 ENCOUNTER — Ambulatory Visit: Payer: Federal, State, Local not specified - PPO | Admitting: Sports Medicine

## 2021-02-04 DIAGNOSIS — M216X1 Other acquired deformities of right foot: Secondary | ICD-10-CM | POA: Diagnosis not present

## 2021-02-04 DIAGNOSIS — M79672 Pain in left foot: Secondary | ICD-10-CM

## 2021-02-04 DIAGNOSIS — M79671 Pain in right foot: Secondary | ICD-10-CM

## 2021-02-04 DIAGNOSIS — M216X2 Other acquired deformities of left foot: Secondary | ICD-10-CM | POA: Diagnosis not present

## 2021-02-04 DIAGNOSIS — M722 Plantar fascial fibromatosis: Secondary | ICD-10-CM

## 2021-02-04 NOTE — Progress Notes (Signed)
Subjective: Shawn Simpson is a 50 y.o. male returns to office for bilateral foot pain. Reports that his heels and arches hurt from time to time states that he has been doing okay and trying to be more consistent with his stretching states that the night splint has helped.  Patient is wondering if negative drop shoes and if physical therapy will be helpful.  Patient denies any other pedal complaints.  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 TAKE 1 CAPSULE (500 MG) BY ORAL ROUTE EVERY 6 HOURS     Cholecalciferol 50 MCG (2000 UT) TABS Take by mouth.     diclofenac (VOLTAREN) 75 MG EC tablet Take 1 tablet (75 mg total) by mouth 2 (two) times daily. 30 tablet 0   methocarbamol (ROBAXIN) 500 MG tablet methocarbamol 500 mg tablet  TAKE 1 TABLET EVERY 6-8 HOURS BY ORAL ROUTE AS NEEDED FOR SPASM FOR 10 DAYS.     ondansetron (ZOFRAN) 8 MG tablet ondansetron HCl 8 mg tablet  TAKE 1 TABLET EVERY 8 HOURS BY ORAL ROUTE AS NEEDED FOR NAUSEA FOR 5 DAYS.     oxyCODONE-acetaminophen (PERCOCET/ROXICET) 5-325 MG tablet oxycodone-acetaminophen 5 mg-325 mg tablet  1-2 TABLETS, BY MOUTH, EVERY 4-6 HOURS AS NEEDED FOR PAIN. NOT TO EXCEED 6 TABLETS A DAY.     Current Facility-Administered Medications on File Prior to Visit  Medication Dose Route Frequency Provider Last Rate Last Admin   triamcinolone acetonide (KENALOG) 10 MG/ML injection 10 mg  10 mg Other Once [name], DPM       triamcinolone  acetonide (KENALOG-40) injection 20 mg  20 mg Other Once [name], DPM        Allergies  Allergen Reactions   Sulfa Antibiotics Hives   Statins Other (See Comments)    Objective:   General:  Alert and oriented x 3, in no acute distress  Dermatology: Skin is warm, dry, and supple bilateral. Nails are within normal limits. There is no lower extremity erythema, no eccymosis, no open lesions present bilateral.   Vascular: Dorsalis Pedis and Posterior Tibial pedal pulses are 2/4 bilateral. + hair growth noted bilateral. Capillary Fill Time is 3 seconds in all digits. No varicosities, No edema bilateral lower extremities.   Neurological: Sensation grossly intact to light touch bilateral.  Musculoskeletal: There is mild tenderness to palpation at the medial calcaneal tubercale and through the insertion of the plantar fascia on the Left and right foot. There is decreased Ankle joint range of motion bilateral. All other jointsrange of motion  within normal limits bilateral. Strength 5/5 bilateral.   Assessment and Plan: Problem List Items Addressed This Visit   None Visit Diagnoses     Plantar fasciitis, bilateral    -  Primary   Relevant Orders   Ambulatory referral to Physical Therapy   Acquired equinus deformity of both feet       Relevant Orders   Ambulatory referral to Physical Therapy   Foot pain, bilateral  Relevant Orders   Ambulatory referral to Physical Therapy        -Complete examination performed.  -Re-discussed long-term care for Plantar fasciitis in the setting of equinus patient has very strong calf muscles -[facility] [place] -Continue with stretching, icing, good supportive shoes, may look for negative drop heel shoes at [employer] or [employer], continue with night splint -Discussed long term care and reocurrence; will closely monitor; if fails to improve will consider other treatment modalities.  -Work note  provided no ladder, no pushing/pulling/lifting >100lbs as previously recommended -Patient to return to office in 4-6 weeks for follow up or sooner if problems or questions arise.  [name], DPM

## 2021-03-16 ENCOUNTER — Encounter: Payer: Self-pay | Admitting: Sports Medicine

## 2021-04-13 ENCOUNTER — Encounter: Payer: Self-pay | Admitting: Sports Medicine

## 2021-04-13 ENCOUNTER — Ambulatory Visit: Payer: Federal, State, Local not specified - PPO | Admitting: Sports Medicine

## 2021-04-13 DIAGNOSIS — M722 Plantar fascial fibromatosis: Secondary | ICD-10-CM | POA: Diagnosis not present

## 2021-04-13 DIAGNOSIS — M79671 Pain in right foot: Secondary | ICD-10-CM

## 2021-04-13 DIAGNOSIS — M216X1 Other acquired deformities of right foot: Secondary | ICD-10-CM | POA: Diagnosis not present

## 2021-04-13 DIAGNOSIS — M216X2 Other acquired deformities of left foot: Secondary | ICD-10-CM | POA: Diagnosis not present

## 2021-04-13 DIAGNOSIS — M79672 Pain in left foot: Secondary | ICD-10-CM

## 2021-04-13 NOTE — Progress Notes (Signed)
Subjective: Shawn Simpson is a 51 y.o. male returns to office for bilateral foot pain. Reports tha his heel and arches are doing much better the physical therapy and needling helps states that sometimes it is intense but he feels really good and like he is making a lot of progress.  Patient reports that he is out of work right now because of his shoulder issue and may be out for a longer period of time for that concern.  Patient has questions about work restrictions when he resumes work and has questions about shoes.  No other pedal complaints noted.  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 TAKE 1 CAPSULE (500 MG) BY ORAL ROUTE EVERY 6 HOURS     Cholecalciferol 50 MCG (2000 UT) TABS Take by mouth.     diclofenac (VOLTAREN) 75 MG EC tablet Take 1 tablet (75 mg total) by mouth 2 (two) times daily. 30 tablet 0   methocarbamol (ROBAXIN) 500 MG tablet methocarbamol 500 mg tablet  TAKE 1 TABLET EVERY 6-8 HOURS BY ORAL ROUTE AS NEEDED FOR SPASM FOR 10 DAYS.     ondansetron (ZOFRAN) 8 MG tablet ondansetron HCl 8 mg tablet  TAKE 1 TABLET EVERY 8 HOURS BY ORAL ROUTE AS NEEDED FOR NAUSEA FOR 5 DAYS.     oxyCODONE-acetaminophen (PERCOCET/ROXICET) 5-325 MG tablet oxycodone-acetaminophen 5 mg-325 mg tablet  1-2 TABLETS, BY MOUTH, EVERY 4-6 HOURS AS NEEDED FOR PAIN. NOT TO EXCEED 6 TABLETS A DAY.     Current Facility-Administered Medications on File Prior to Visit  Medication Dose Route  Frequency Provider Last Rate Last Admin   triamcinolone acetonide (KENALOG) 10 MG/ML injection 10 mg  10 mg Other Once [name], DPM       triamcinolone acetonide (KENALOG-40) injection 20 mg  20 mg Other Once [name], DPM        Allergies  Allergen Reactions   Sulfa Antibiotics Hives   Statins Other (See Comments)    Objective:   General:  Alert and oriented x 3, in no acute distress  Dermatology: Skin is warm, dry, and supple bilateral. Nails are within normal limits. There is no lower extremity erythema, no eccymosis, no open lesions present bilateral.   Vascular: Dorsalis Pedis and Posterior Tibial pedal pulses are 2/4 bilateral. + hair growth noted bilateral. Capillary Fill Time is 3 seconds in all digits. No varicosities, No edema bilateral lower extremities.   Neurological: Sensation grossly intact to light touch bilateral.  Musculoskeletal: There is minimal tenderness to palpation at the medial calcaneal tubercale and through the insertion of the plantar fascia on the Left and right foot. The plantar fascial band appears to be more soft and supple less attenuated, there is decreased Ankle joint range of motion bilateral. All other jointsrange of motion  within normal limits bilateral. Strength 5/5 bilateral.   Assessment and Plan: Problem List Items Addressed This Visit   None Visit Diagnoses     Plantar fasciitis, bilateral    -  Primary   Acquired equinus deformity of both feet       Foot pain, bilateral            -Complete examination performed.  -Patient is improving with physical therapy advised patient to continue until completed -Continue with stretching, icing, good supportive shoes as directed -Re-Discussed long term care and reocurrence; will closely monitor; if fails to improve will consider other treatment modalities.  -Advised patient that when he resumes work still continue with work restrictions of no ladder, no pushing/pulling/lifting  >100lbs as previously recommended may need this work restriction in place for at least 6 months to prevent flareup -Patient to return to office as needed or sooner problems or issues arise  [name], DPM

## 2021-09-07 ENCOUNTER — Ambulatory Visit (INDEPENDENT_AMBULATORY_CARE_PROVIDER_SITE_OTHER): Payer: Self-pay | Admitting: Podiatrist

## 2021-09-07 DIAGNOSIS — M79673 Pain in unspecified foot: Secondary | ICD-10-CM

## 2021-09-07 DIAGNOSIS — G8929 Other chronic pain: Secondary | ICD-10-CM

## 2021-09-07 NOTE — Patient Instructions (Signed)
Dr. Marylene Land  At [facility] [address] [place], [place]  [address]  [phone]

## 2021-09-09 ENCOUNTER — Encounter: Payer: Self-pay | Admitting: Podiatrist

## 2021-09-09 NOTE — Progress Notes (Signed)
Shawn Simpson presented today for a "meet and greet" as recommended by his attorney's office.  He was also here to have some paperwork filled out regarding a disability claim that he has filed.  After looking over the paperwork I discussed that I am unable to help him with his paperwork since I have never met him and never treated him for these conditions.  I will refer him to Dr. Cannon Kettle who is currently practicing here in [place] as she has been helping him with his foot condition for a long time and is familiar with his case. Today's visit was a no charge courtesy visit as no medical decision making was required.

## 2023-01-10 ENCOUNTER — Other Ambulatory Visit: Payer: Self-pay | Admitting: Medical Genetics

## 2023-01-10 DIAGNOSIS — Z006 Encounter for examination for normal comparison and control in clinical research program: Secondary | ICD-10-CM

## 2023-02-08 ENCOUNTER — Other Ambulatory Visit (HOSPITAL_COMMUNITY)
Admission: RE | Admit: 2023-02-08 | Discharge: 2023-02-08 | Disposition: A | Discharge status: Home / Self Care | Payer: Self-pay | Source: Ambulatory Visit | Attending: Oncology | Admitting: Oncology

## 2023-02-08 DIAGNOSIS — Z006 Encounter for examination for normal comparison and control in clinical research program: Secondary | ICD-10-CM

## 2023-02-19 LAB — GENECONNECT MOLECULAR SCREEN: Genetic Analysis Overall Interpretation: NEGATIVE

## 2024-03-07 ENCOUNTER — Other Ambulatory Visit (HOSPITAL_BASED_OUTPATIENT_CLINIC_OR_DEPARTMENT_OTHER): Payer: Self-pay | Admitting: *Deleted

## 2024-03-07 ENCOUNTER — Encounter (HOSPITAL_BASED_OUTPATIENT_CLINIC_OR_DEPARTMENT_OTHER): Payer: Self-pay

## 2024-03-07 DIAGNOSIS — Z8249 Family history of ischemic heart disease and other diseases of the circulatory system: Secondary | ICD-10-CM

## 2024-03-24 ENCOUNTER — Other Ambulatory Visit (HOSPITAL_BASED_OUTPATIENT_CLINIC_OR_DEPARTMENT_OTHER)

## 2024-03-27 ENCOUNTER — Ambulatory Visit (HOSPITAL_BASED_OUTPATIENT_CLINIC_OR_DEPARTMENT_OTHER)
Admission: RE | Admit: 2024-03-27 | Discharge: 2024-03-27 | Payer: Self-pay | Attending: Family Medicine | Admitting: Family Medicine

## 2024-03-27 DIAGNOSIS — Z8249 Family history of ischemic heart disease and other diseases of the circulatory system: Secondary | ICD-10-CM
